# Patient Record
Sex: Male | Born: 1955 | Race: White | Hispanic: No | Marital: Married | Smoking: Never smoker
Health system: Southern US, Community
[De-identification: ages and names within clinical notes are randomized; demographics above are authoritative.]

## PROBLEM LIST (undated history)

## (undated) DIAGNOSIS — E119 Type 2 diabetes mellitus without complications: Secondary | ICD-10-CM

## (undated) DIAGNOSIS — I10 Essential (primary) hypertension: Secondary | ICD-10-CM

## (undated) DIAGNOSIS — E785 Hyperlipidemia, unspecified: Secondary | ICD-10-CM

## (undated) HISTORY — DX: Essential (primary) hypertension: I10

## (undated) HISTORY — DX: Hyperlipidemia, unspecified: E78.5

## (undated) HISTORY — DX: Type 2 diabetes mellitus without complications: E11.9

---

## 2017-03-26 ENCOUNTER — Ambulatory Visit (INDEPENDENT_AMBULATORY_CARE_PROVIDER_SITE_OTHER): Payer: BLUE CROSS/BLUE SHIELD | Admitting: Family Medicine

## 2017-03-26 ENCOUNTER — Encounter (INDEPENDENT_AMBULATORY_CARE_PROVIDER_SITE_OTHER): Payer: Self-pay

## 2017-03-26 VITALS — BP 180/100 | HR 77 | Temp 98.9°F | Resp 18 | Ht 67.0 in | Wt 260.0 lb

## 2017-03-26 DIAGNOSIS — I10 Essential (primary) hypertension: Secondary | ICD-10-CM

## 2017-03-26 DIAGNOSIS — R42 Dizziness and giddiness: Secondary | ICD-10-CM

## 2017-03-26 NOTE — Progress Notes (Signed)
Emergency Department Transfer    Pt transferred to: [facility]    Reason for transfer: R/O Stroke    Pertinent studies performed: see progress note below  Mode of transportation: Private Vehicle - Family/Friend to drive patient    Spoke to: Dr. Mellody Dance  at receiving facility    Pt expressed understanding of the reason for transfer to the ED, and agreed to go directly there.        Subjective:    Patient ID: Andre Burgess is a 61 y.o. male.    Dizziness   This is a new problem. The current episode started yesterday (woke up yesterday with dizziness ). The problem occurs constantly. The problem has been unchanged. Associated symptoms include headaches, nausea and weakness. Pertinent negatives include no abdominal pain, change in bowel habit, chest pain, chills, congestion, coughing, fatigue, fever, myalgias, neck pain, numbness, sore throat, vertigo, visual change or vomiting. Associated symptoms comments: 8/10 on pain scale; left frontal region. Sharp/stabbing pain no improved with motrin . Nothing aggravates the symptoms. He has tried NSAIDs for the symptoms. The treatment provided mild relief.       The following portions of the patient's history were reviewed and updated as appropriate: allergies, current medications, past medical history, past social history, past surgical history and problem list.    Review of Systems   Constitutional: Negative for chills, fatigue and fever.   HENT: Negative for congestion and sore throat.    Respiratory: Negative for cough.    Cardiovascular: Negative for chest pain.   Gastrointestinal: Positive for nausea. Negative for abdominal pain, change in bowel habit and vomiting.   Musculoskeletal: Negative for myalgias and neck pain.   Neurological: Positive for dizziness, weakness and headaches. Negative for vertigo and numbness.         Objective:    BP (!) 180/100   Pulse 77   Temp 98.9 F (37.2 C) (Oral)   Resp 18   Ht 1.702 m (5\' 7" )   Wt 117.9 kg (260 lb)    SpO2 97%   BMI 40.72 kg/m     BP elevated; reviewed. Pt reports that his BP is usually wnl.     Physical Exam   Constitutional: He is oriented to person, place, and time. He appears well-developed and well-nourished. No distress.   HENT:   Head: Normocephalic and atraumatic.   Right Ear: Tympanic membrane, external ear and ear canal normal.   Left Ear: Tympanic membrane, external ear and ear canal normal.   Nose: Nose normal.   Mouth/Throat: Uvula is midline, oropharynx is clear and moist and mucous membranes are normal.   Eyes: Pupils are equal, round, and reactive to light. Conjunctivae and EOM are normal.   Neck: Normal range of motion. Neck supple.   Cardiovascular: Normal rate and regular rhythm.    Pulmonary/Chest: Effort normal and breath sounds normal.   Musculoskeletal: Normal range of motion.   Neurological: He is alert and oriented to person, place, and time. He has normal strength. No cranial nerve deficit or sensory deficit. He displays a negative Romberg sign. Coordination and gait normal. GCS eye subscore is 4. GCS verbal subscore is 5. GCS motor subscore is 6.   Reflex Scores:       Patellar reflexes are 2+ on the right side and 2+ on the left side.       Achilles reflexes are 2+ on the right side and 2+ on the left side.  Skin: Skin is warm  and dry. He is not diaphoretic.   Psychiatric: He has a normal mood and affect.   Nursing note and vitals reviewed.        Assessment and Plan:       Andre Burgess was seen today for dizziness.    Diagnoses and all orders for this visit:    Dizziness  -     POCT Glucose, Fingerstick 141  -     ECG 12 lead- nsr; RBBB, no STEMI   -     Pulse oximetry, spot 97% on RA   Due to concern for possible stroke vs symptomatic hypertensive urgency advised transfer to the ER for further evaluation and treatment   Patient/guardian expressed understanding and agreement with plan of care at time of discharge.          Isaiah Blakes, MD  [facility]  03/26/2017   1:49 PM

## 2017-03-29 ENCOUNTER — Telehealth (INDEPENDENT_AMBULATORY_CARE_PROVIDER_SITE_OTHER): Payer: Self-pay

## 2017-03-29 NOTE — Telephone Encounter (Signed)
Called to check on patient after recent visit.  Patient did not go to the ED.

## 2017-08-10 IMAGING — DX THORACIC SPINE 3 VIEWS
1 series · 6 of 6 positions shown · non-contrast
Comparison: None

THORACIC SPINE 3 VIEWS 08/10/2017 [DATE]: 
CLINICAL INDICATION: Lower thoracic pain

[Series 1: AP · U · 0.14mm/px · 6 of 6 slices shown]
[im 1/6]
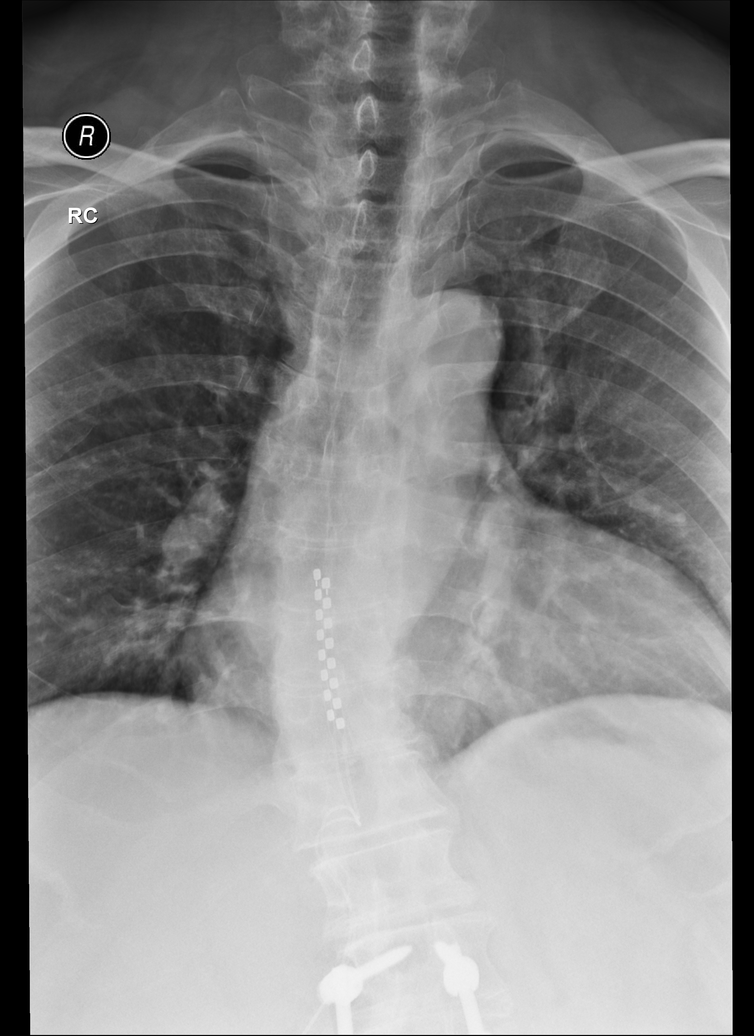
[im 2/6]
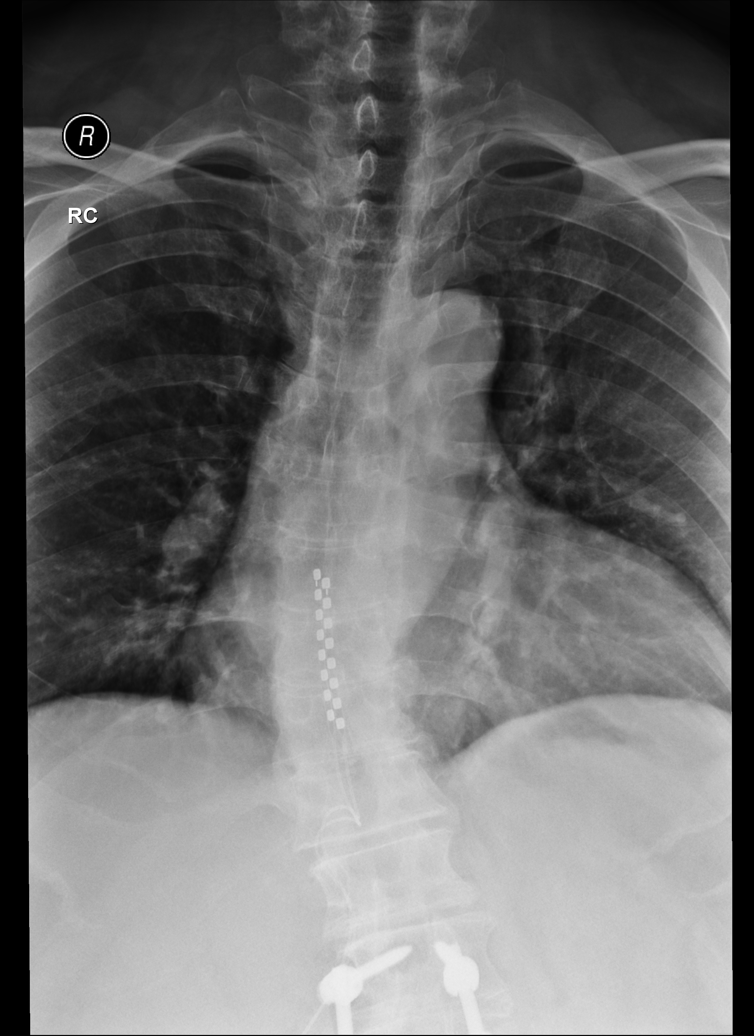
[im 3/6]
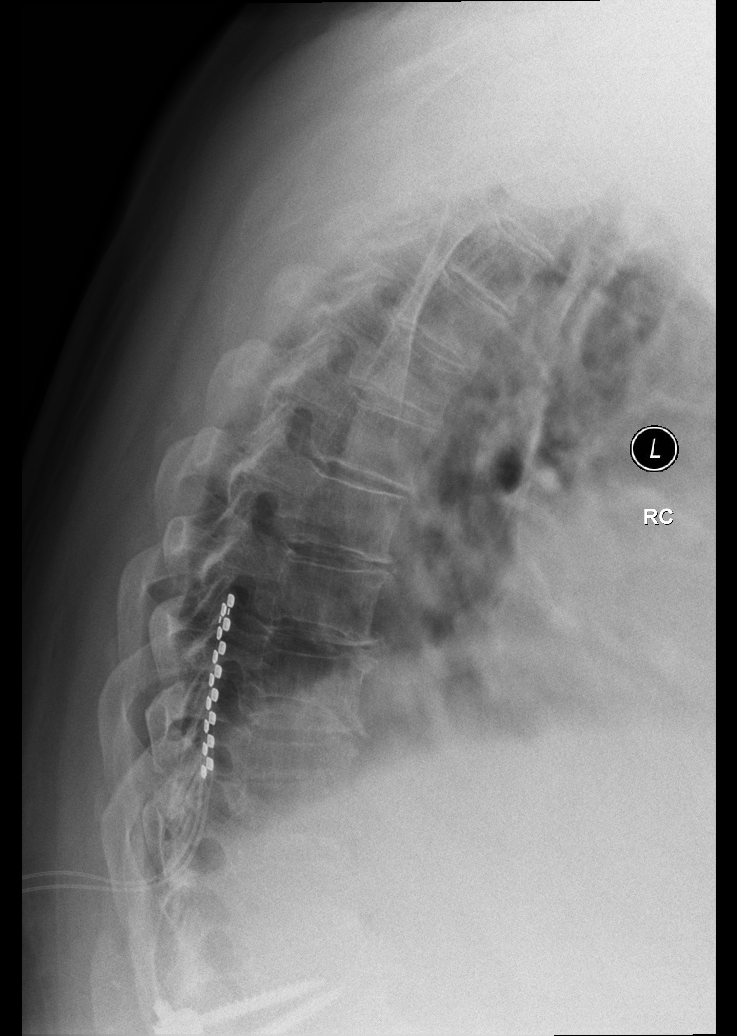
[im 4/6]
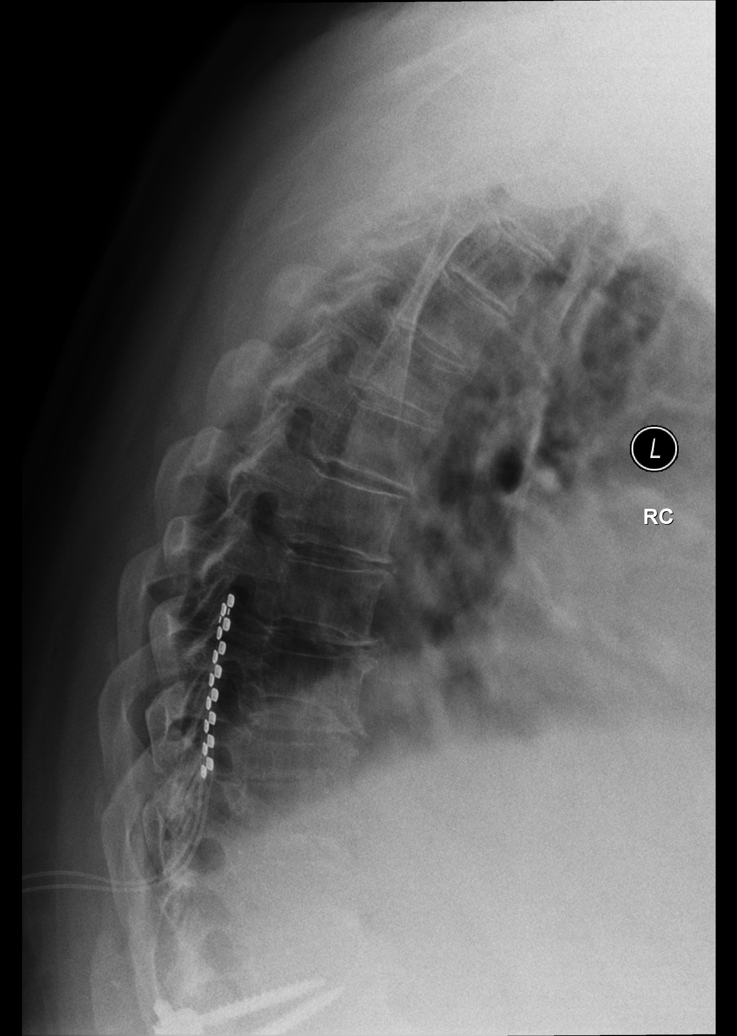
[im 5/6]
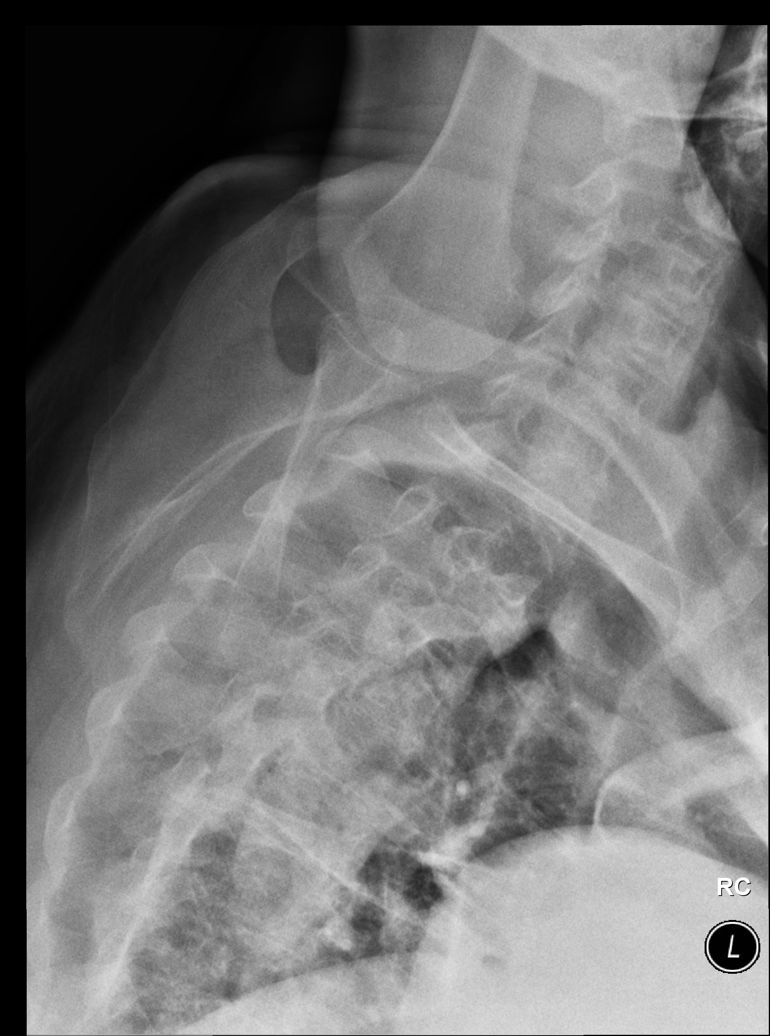
[im 6/6]
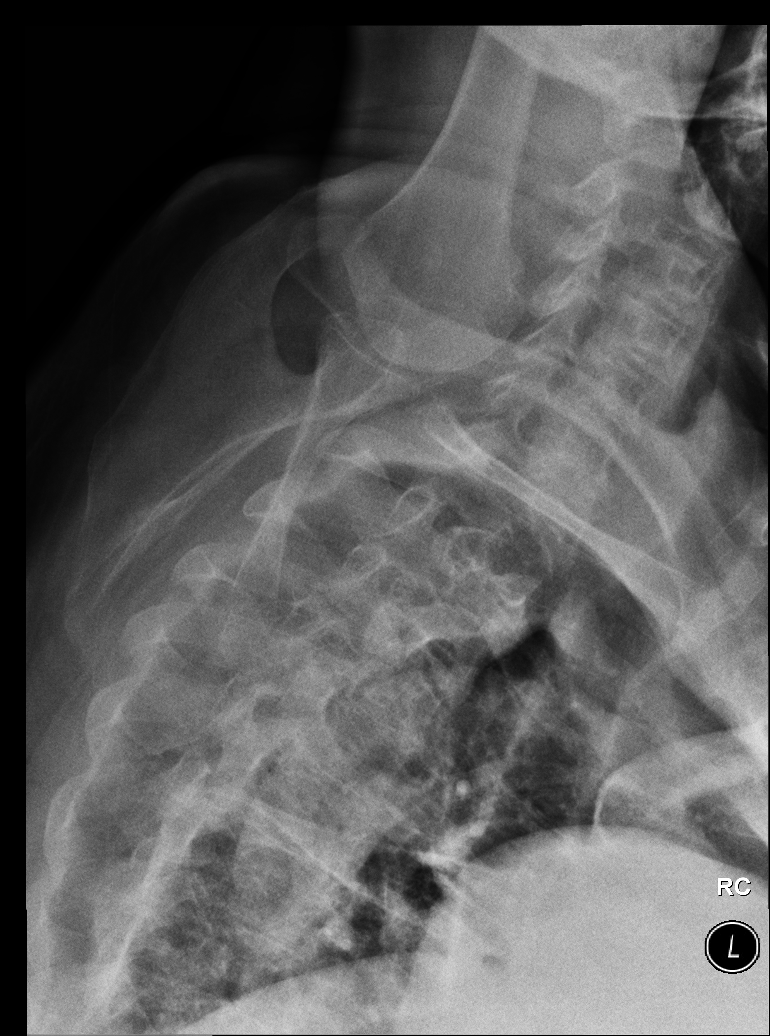

[6 of 6 positions shown; findings below may reference images not displayed]

FINDINGS: Thoracic vertebral heights are intact. There is moderate lower 
thoracic dextroscoliosis. There are degenerative changes, including moderate 
multilevel disc narrowing with endplate sclerosis and mild marginal 
osteophyte. 
There is a pain stimulator, with the stimulator array lying opposite T9-T11, 
without evidence for lead fracture. There are lumbar pedicle screws, not 
completely evaluated.
IMPRESSION: Spinal stimulator in the lower thoracic spine appears appropriately 
positioned. 
Degenerative changes associated with moderate lower thoracic dextroscoliosis. 
No 
evidence for fracture or bone destruction.

## 2017-08-29 IMAGING — CT CT LUMBAR SPINE W/WO CONTRAST
4 of 10 series · 7 of 33 positions shown, 8 images · IV contrast (APPLIED)
Comparison: 

CT LUMBAR SPINE W/WO CONTRAST, 08/29/2017 [DATE]: 
CLINICAL INDICATION: Drainage from incision site for spinal cord stimulator 
implant 
A search for DICOM formatted images was conducted for prior CT imaging studies 
completed at a non-affiliated media free facility.
TECHNIQUE: The lumbar spine was scanned from T12 through mid-sacrum without 
and 
with 033cc Isovue 300 contrast injected intravenously on a high-resolution CT 
scanner using dose reduction techniques. Routing MPR reconstructions were 
performed.

[Series 4: l spine 2.0 b31s · axial · 0.49mm/px · z∈[-240,-112]mm · 2 of 194 slices shown, 3 images]
[im 65/194  soft-tissue]
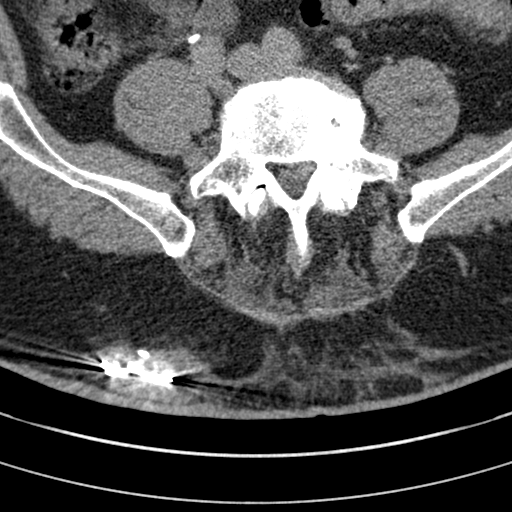
[im 65/194  bone]
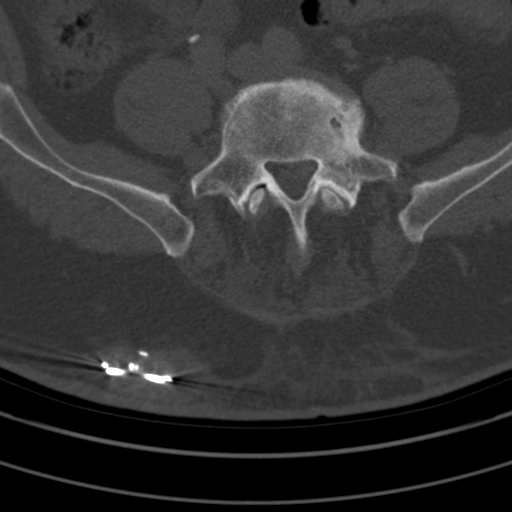
[im 129/194  bone]
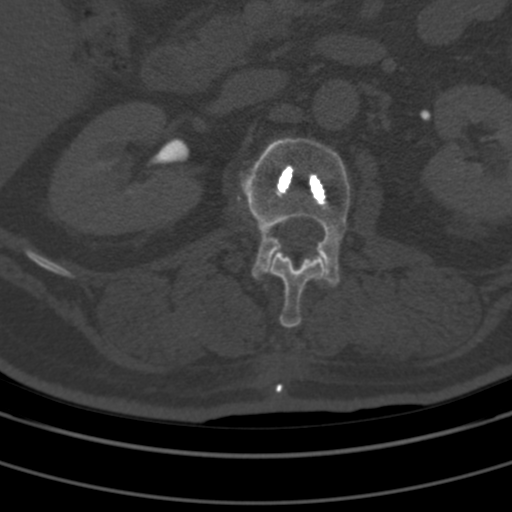

[Series 6: coronal · coronal · 0.50mm/px · 1 of 104 slices shown]
[im 52/104  bone]
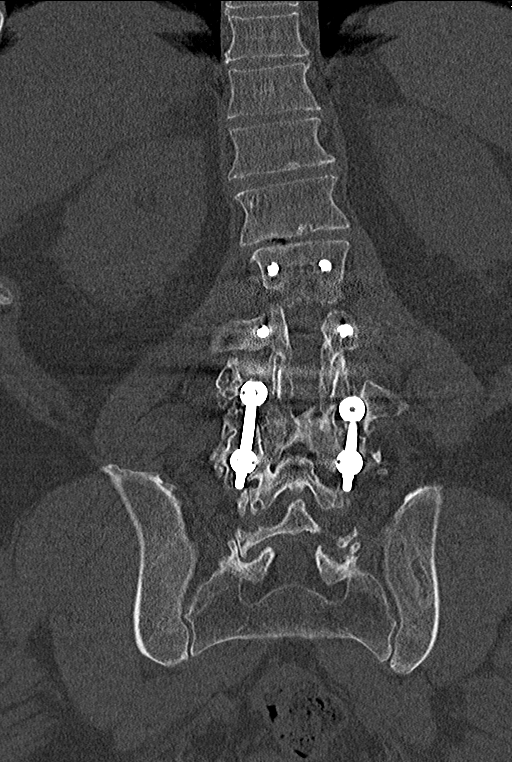

[Series 7: sagittal bone · sagittal · 0.49mm/px · 2 of 130 slices shown]
[im 44/130  bone]
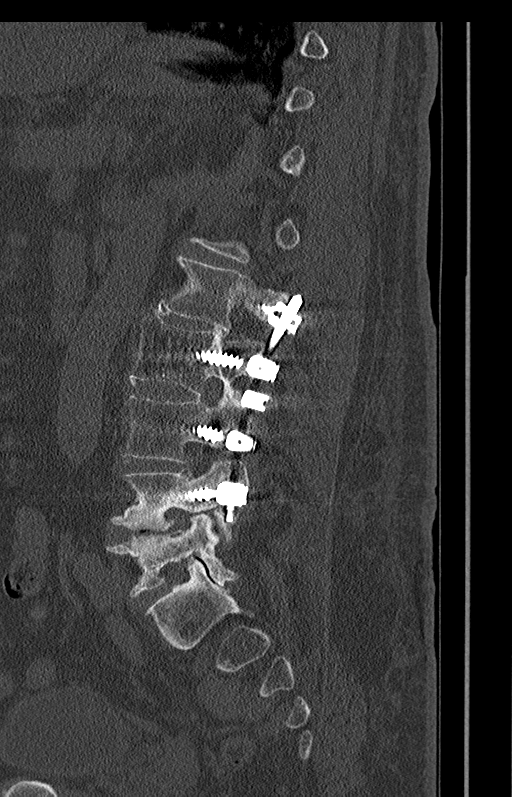
[im 87/130  bone]
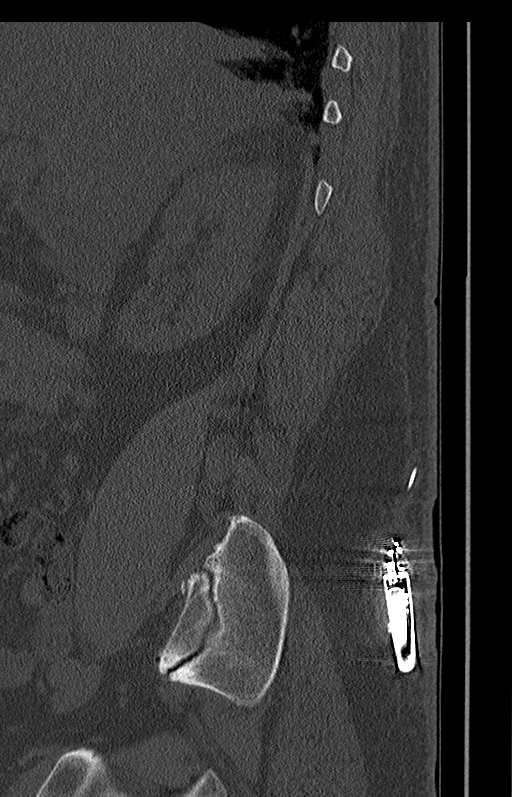

[Series 9: l spine 2.0 (person_name)31(person_name) · axial · 0.49mm/px · z∈[-240,-112]mm · 2 of 194 slices shown]
[im 65/194  bone]
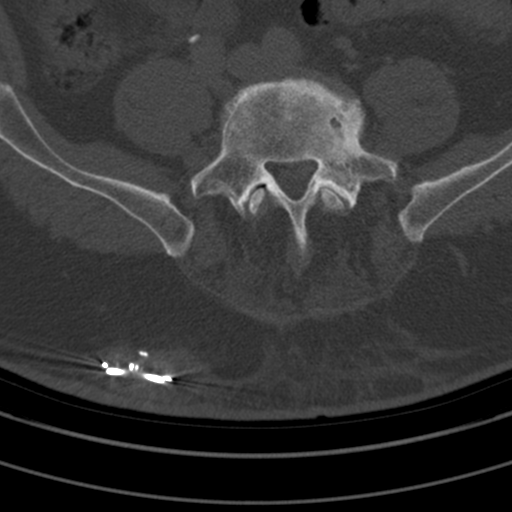
[im 129/194  bone]
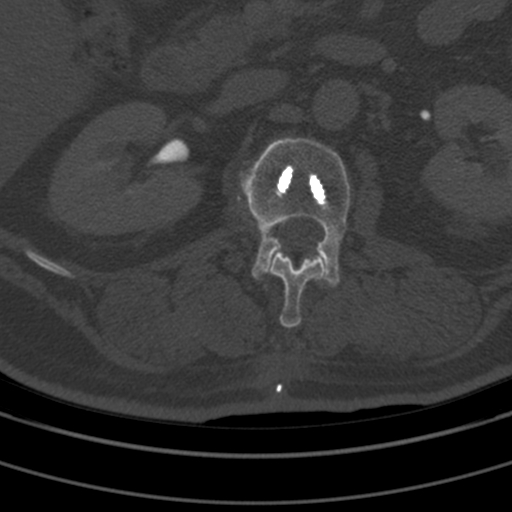

[7 of 33 positions shown; findings below may reference images not displayed]

FINDINGS: There is a spinal cord stimulator entering the neural canal at T10- 
11. 
There has been T11 posterior decompression. There is a small low density 
collection at the skin surface best seen on axial image 48 measuring 8 mm. 
This 
likely represents a small seroma. There is a mild rind of isodense soft tissue 
and mild convex bulging at the skin surface. Cellulitis with developing 
abscess 
cannot be excluded and correlation necessary. There is increased soft tissue 
surrounding the stimulator leads as they course horizontally toward the neural 
canal, most likely representing postoperative changes. No fluid collection in 
this region identified. The leads of the stimulator extends cephalad, with the 
stimulator array appearing opposite T8-T10. There is no evidence for lumbar 
fracture. The patient is status post bilateral pedicle screw fusion from L1 
through L4. There is marked disc narrowing at L1-2 and moderate to marked 
narrowing at L4-5. There is endplate sclerosis at L4-5. No fracture. No 
aggressive bone destruction. There is air within the T12-L1 interspace, most 
likely degenerative. There is no evidence for endplate erosion. 
Axial images are limited by metal artifact. There has been posterior 
decompression at L2, L3 and L4. There does not appear to be significant canal 
stenosis. There is right posterolateral disc-osteophyte at L1-2 mildly 
effacing 
the right ventral thecal sac, potentially encroaching on the exiting right L2 
nerve root as seen on axial images 77-79. The stimulator device overlies the 
upper right buttocks, opposite the posterior medial right iliac wing. There 
are 
changes consistent with bone harvest site along the posteromedial left iliac 
wing. There has been posterior element fusion in the mid lumbar spine.
IMPRESSION: 8 mm low-density collection of the posterior skin surface at the T11 level, at 
the level of entrance of the neural stimulator lead into the neural canal. 
Patient is status post T11 posterior decompression. This collection is 
indeterminant, potentially a small seroma, although focal cellulitis with 
developing abscess cannot be excluded. Soft tissue surrounds the stimulator 
leads as they course horizontally at this level to enter the neural canal, 
without evidence for fluid density. Clinical correlation is necessary. 
No fracture. Air in the intervertebral disc spaces at T12-L1 and L4-5 appears 
degenerative. No specific findings to indicate spondylodiscitis. 
Status post L1-L4 pedicle screw fusion with connecting rods. Hardware appears 
well-positioned. There has been posterior decompression from L2 through L4. No 
significant lumbar canal stenosis, allowing for metal artifact. 
Mild lumbar levoscoliosis. Status post mid lumbar posterior element fusion 
with 
bone harvest site along the posterior medial left iliac bone. 
RADIATION DOSE REDUCTION: All CT scans are performed using radiation dose 
reduction techniques, when applicable.  Technical factors are evaluated and 
adjusted to ensure appropriate moderation of exposure.  Automated dose 
management technology is applied to adjust the radiation doses to minimize 
exposure while achieving diagnostic quality images.

## 2018-02-22 IMAGING — CT CT THORACIC SPINE W/WO CONTRAST
5 of 7 series · 14 of 33 positions shown, 15 images · IV contrast (isovue)
Comparison: Comparison was made to the prior exam(s) within the last 12 
months 
dated  plain film examination of the thoracic spine on August 10, 2017

CT THORACIC SPINE W/WO CONTRAST, 02/22/2018 [DATE]: 
CLINICAL INDICATION: Low back pain. Postlaminectomy syndrome. 
A search for DICOM formatted images was conducted for prior CT imaging studies 
completed at a non-affiliated media free facility.
TECHNIQUE: The thoracic spine was scanned from C7 through L1 vertebra without 
and with 566cc's of Isovue 300 injected intravenously on a high-resolution CT 
scanner using dose reduction techniques. Routing MPR reconstructions were 
performed.

[Series 2: t spine 2.0 b31s · axial · 0.47mm/px · z∈[-284,-146]mm · 2 of 209 slices shown (1 of 2)]
[im 70/209  bone]
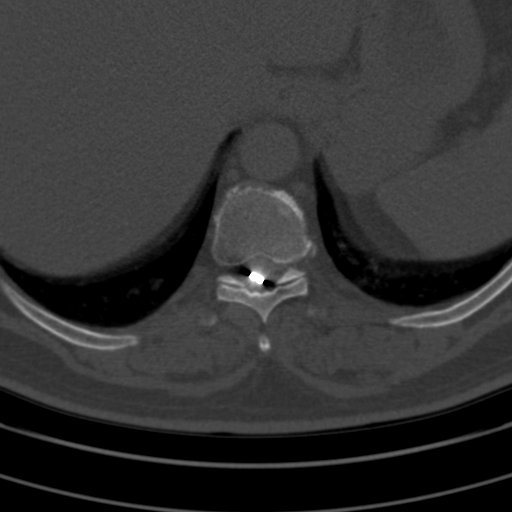
[im 139/209  bone]
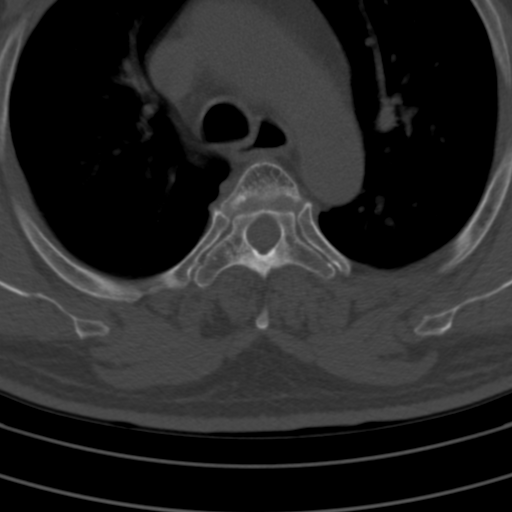

[Series 3: t spine 2.0 b31s · axial · 0.47mm/px · z∈[-314,-110]mm · 3 of 206 slices shown, 4 images (2 of 2)]
[im 52/206  soft-tissue]
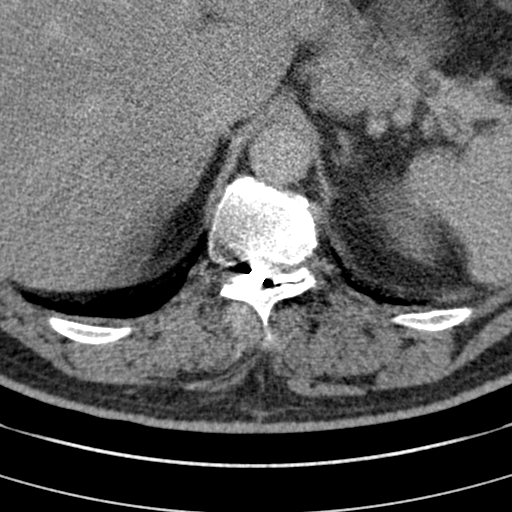
[im 52/206  bone]
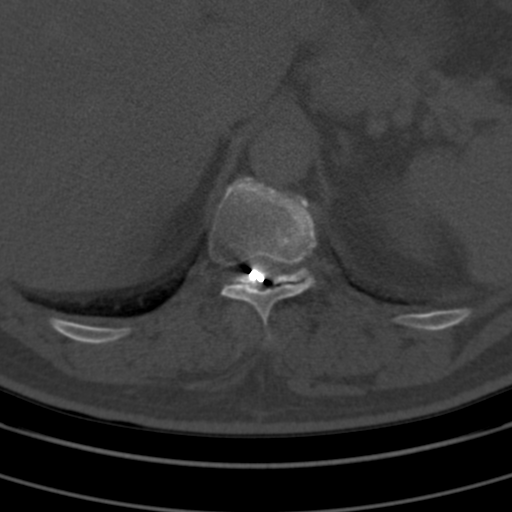
[im 103/206  bone]
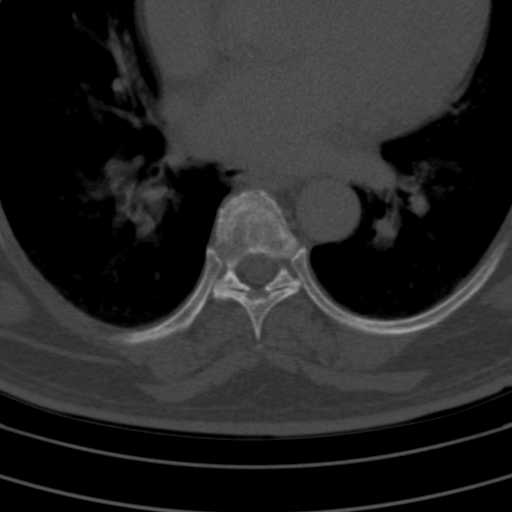
[im 154/206  bone]
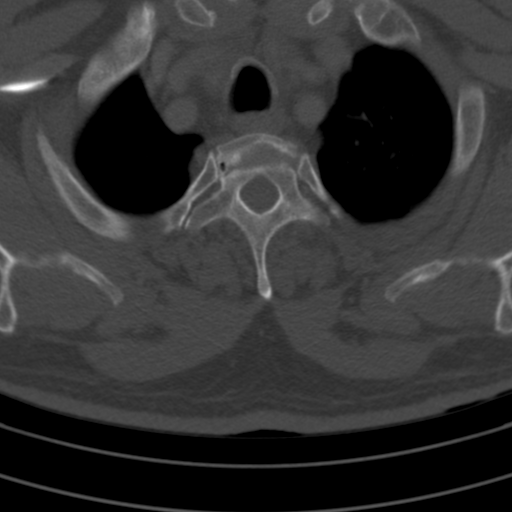

[Series 5: coronal · coronal · 0.50mm/px · 1 of 104 slices shown]
[im 52/104  bone]
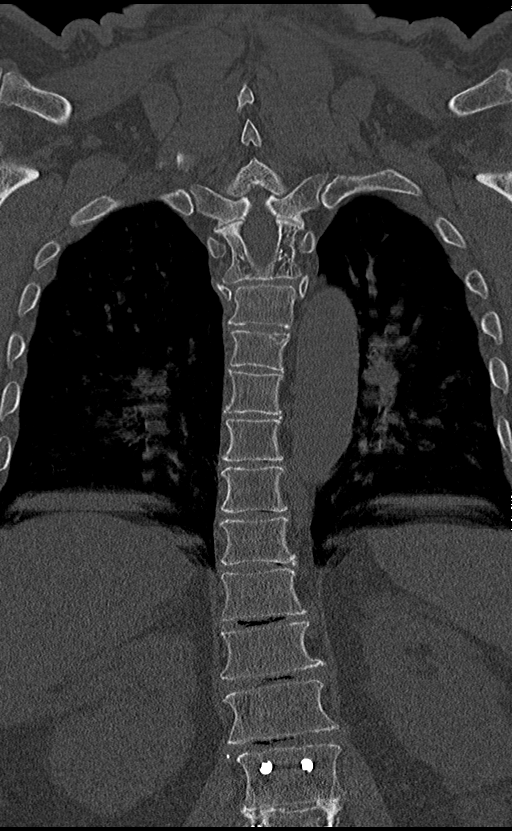

[Series 7: sagittal st · sagittal · 0.46mm/px · 5 of 126 slices shown]
[im 21/126  bone]
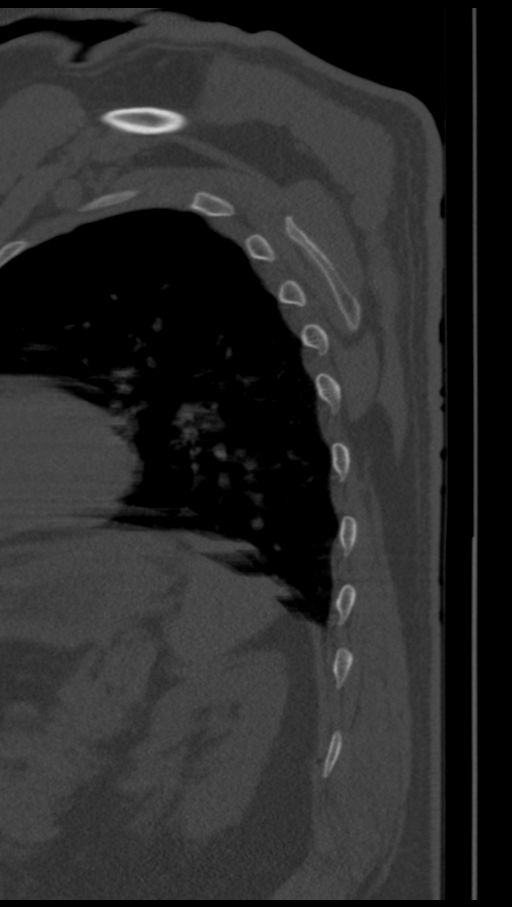
[im 42/126  bone]
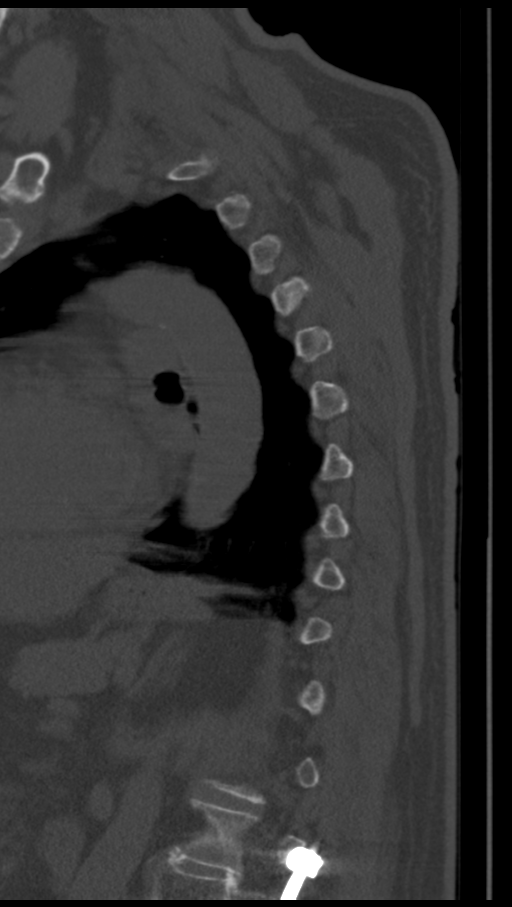
[im 63/126  bone]
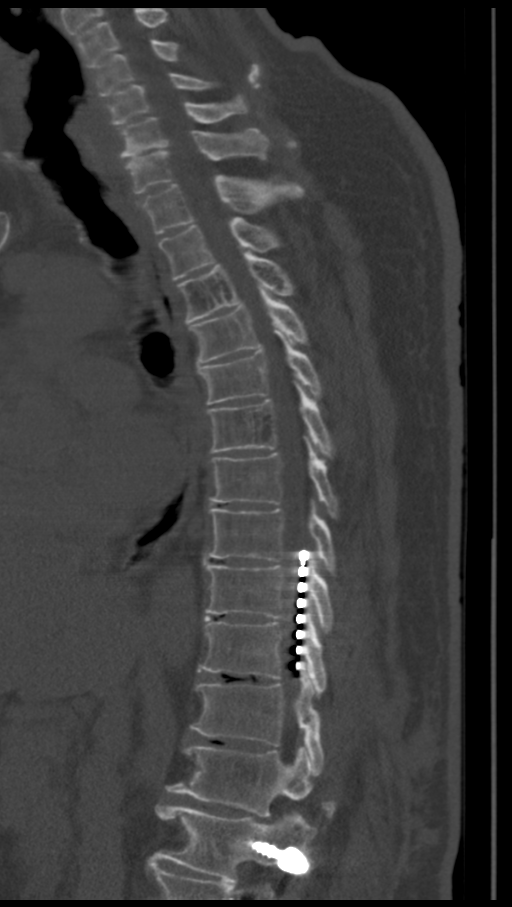
[im 84/126  bone]
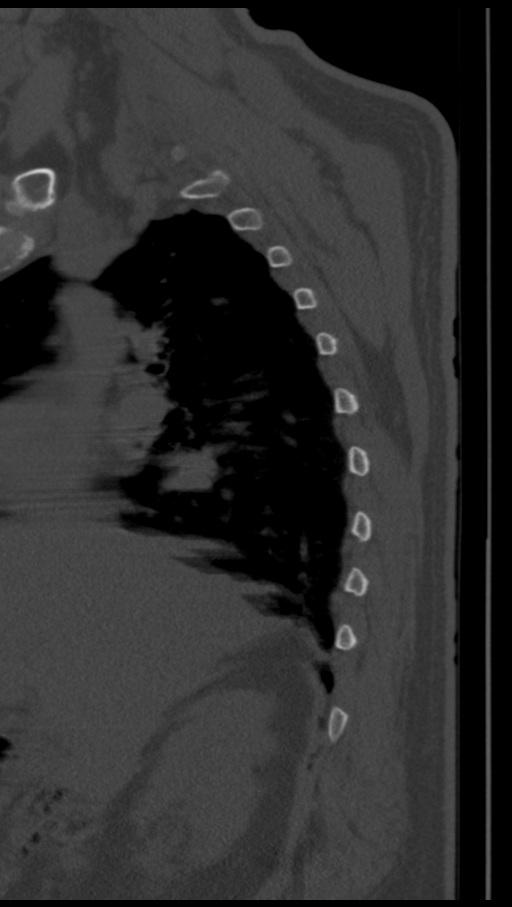
[im 105/126  bone]
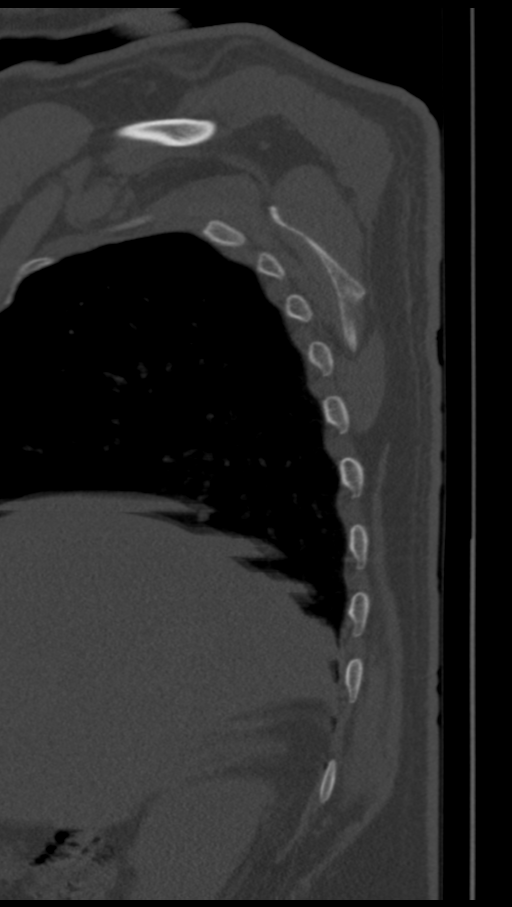

[Series 8: t spine 2.0 bone · axial · 0.47mm/px · z∈[-312,-108]mm · 3 of 205 slices shown]
[im 52/205  bone]
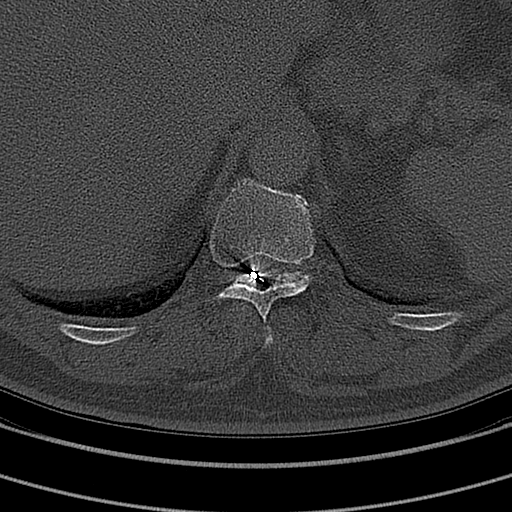
[im 103/205  bone]
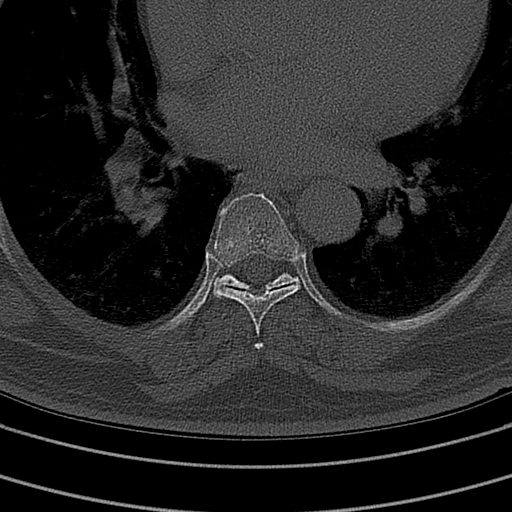
[im 154/205  bone]
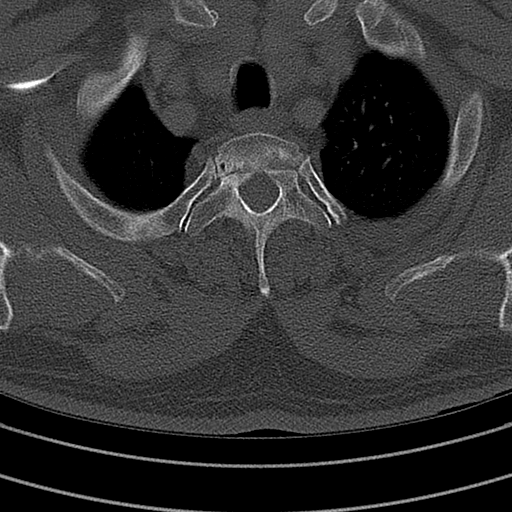

[14 of 33 positions shown; findings below may reference images not displayed]

FINDINGS: Spinal stimulator is visible at the lower thoracic spine. Moderate 
degenerative arthritis in the thoracic spine is present with small marginal 
osteophytes. No compression fracture. No osteolytic or osteoblastic lesion. 
Vertical striation at T4 and T7 are present from vertebral hemangiomas. 
Degenerative disc disease at C7-T1 level is present no lytic or blastic 
lesion. 
Pedicles are intact. The spinal canal is open.
IMPRESSION: Moderate degenerative arthritis at the thoracic spine. 
RADIATION DOSE REDUCTION: All CT scans are performed using radiation dose 
reduction techniques, when applicable.  Technical factors are evaluated and 
adjusted to ensure appropriate moderation of exposure.  Automated dose 
management technology is applied to adjust the radiation doses to minimize 
exposure while achieving diagnostic quality images.

## 2019-01-31 IMAGING — CT CT BRAIN WITHOUT CONTRAST
3 series · 15 of 47 positions shown, 18 images · non-contrast
Comparison: 

CT BRAIN WITHOUT CONTRAST, 01/31/2019 [DATE]: 
CLINICAL INDICATION:  Right hemiparesis, hemiplegia x2 hours 
A search for DICOM formatted images was conducted for prior CT imaging studies 
completed at a non-affiliated media free facility.
TECHNIQUE: The head was scanned from vertex through skull base without contrast 
on a high resolution CT scanner using dose reduction techniques. Routine MPR 
reconstructions were performed

[Series 2: head w/o 3.0 j30s 1 · axial · non-contrast · 0.43mm/px · z∈[-178,-28]mm · 9 of 58 slices shown, 12 images]
[im 4/58  brain]
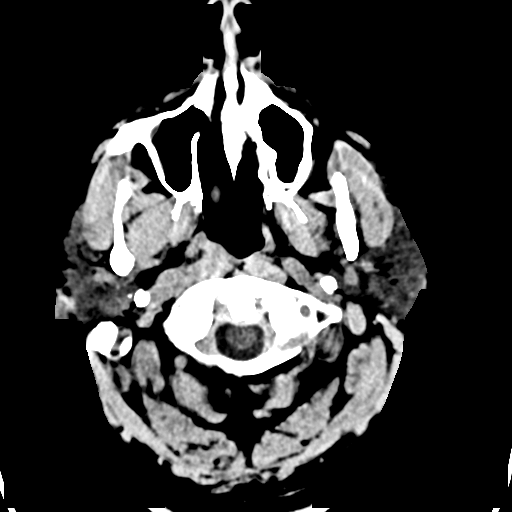
[im 4/58  bone]
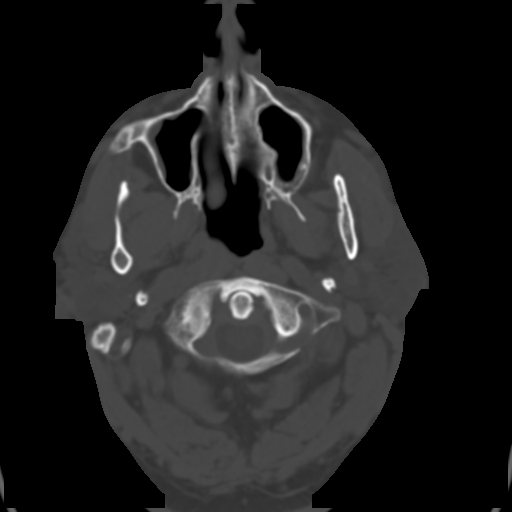
[im 10/58  brain]
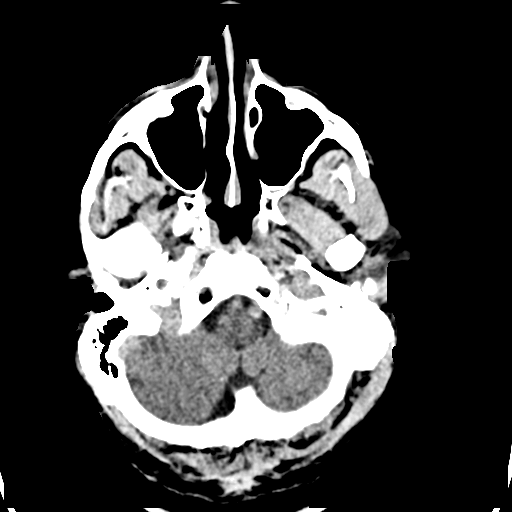
[im 16/58  brain]
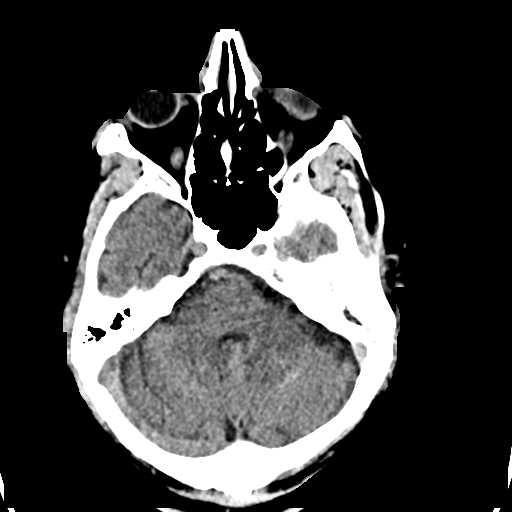
[im 22/58  brain]
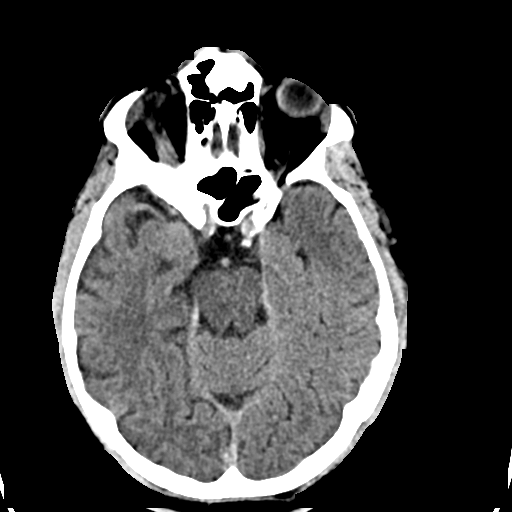
[im 30/58  brain]
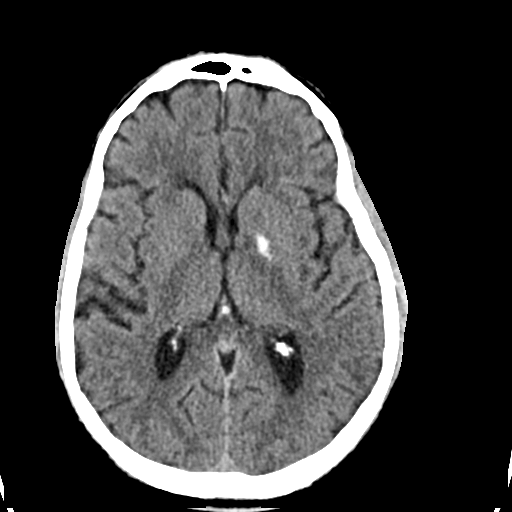
[im 30/58  bone]
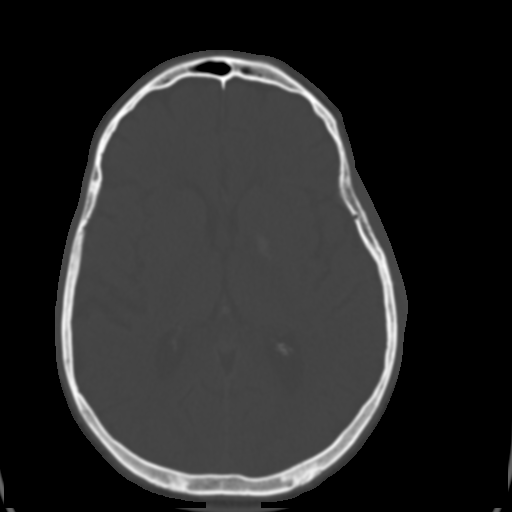
[im 36/58  brain]
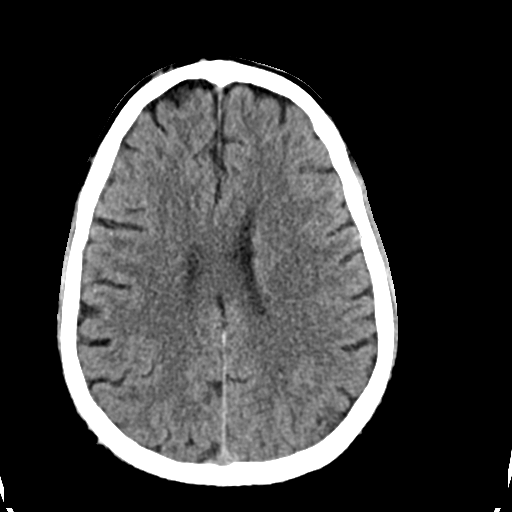
[im 42/58  brain]
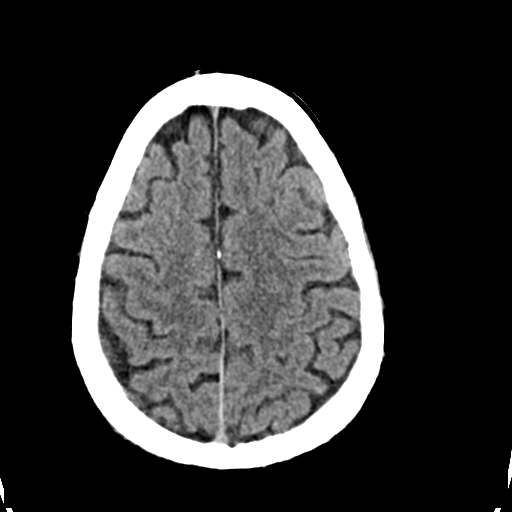
[im 48/58  brain]
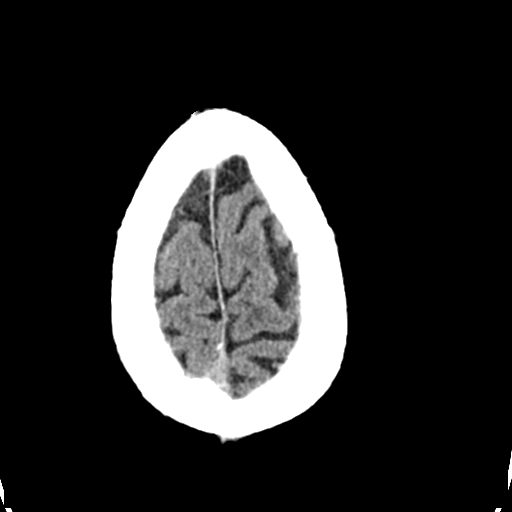
[im 54/58  brain]
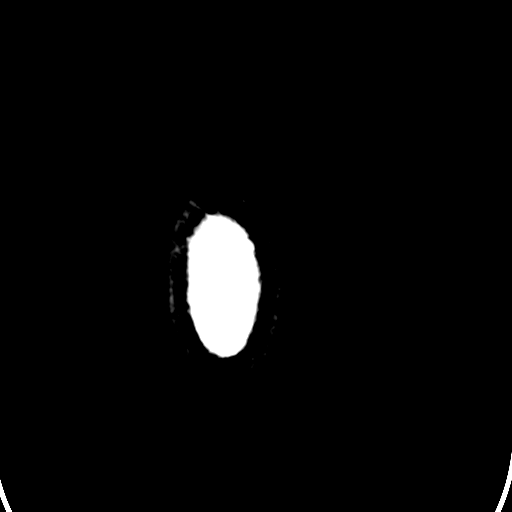
[im 54/58  bone]
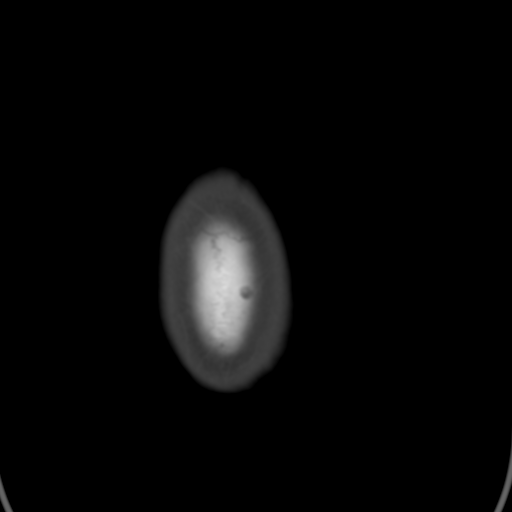

[Series 4: coronal · coronal · 0.33mm/px · 3 of 109 slices shown]
[im 37/109  brain]
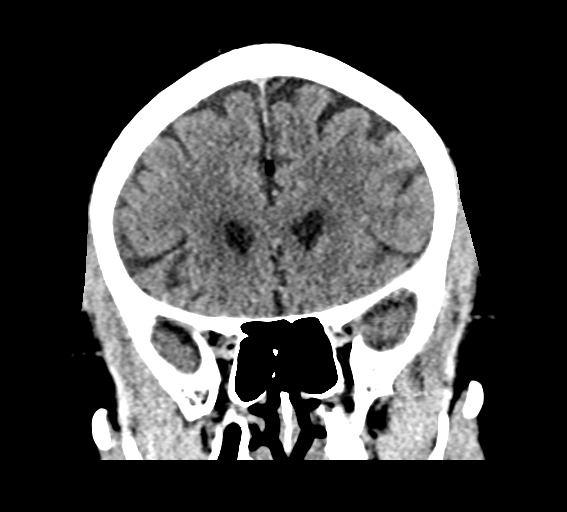
[im 49/109  brain]
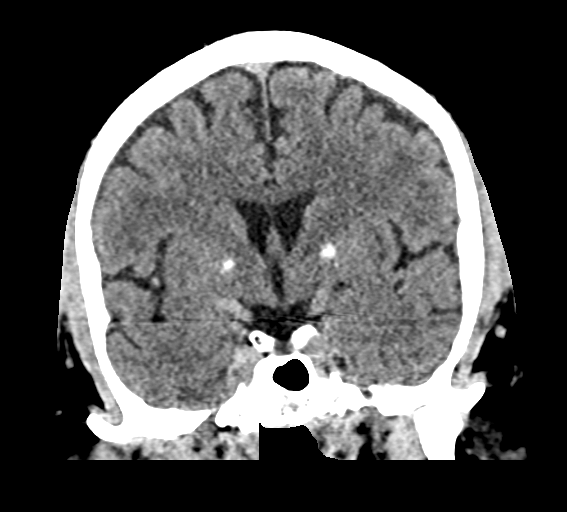
[im 61/109  brain]
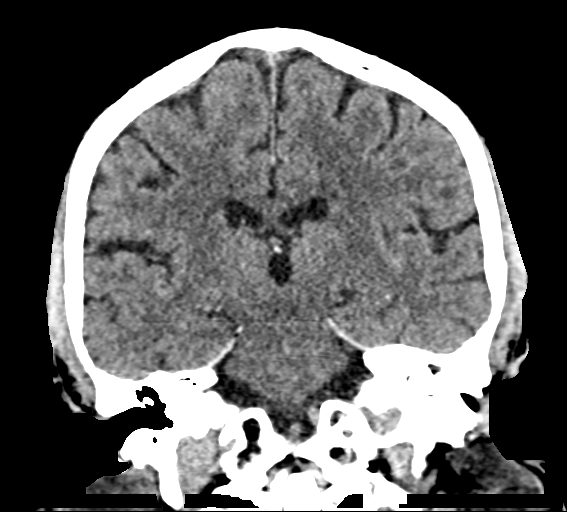

[Series 5: sag · sagittal · 0.36mm/px · 3 of 61 slices shown]
[im 21/61  brain]
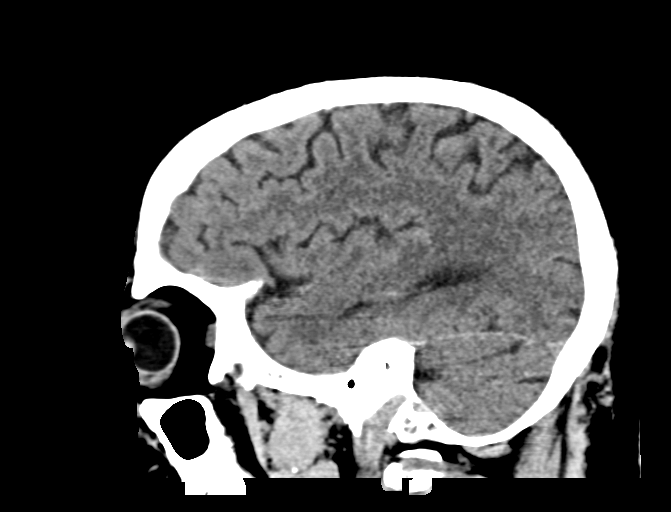
[im 31/61  brain]
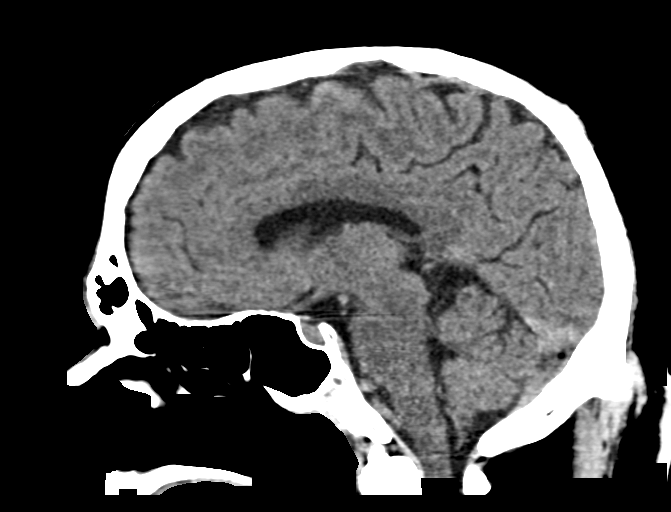
[im 41/61  brain]
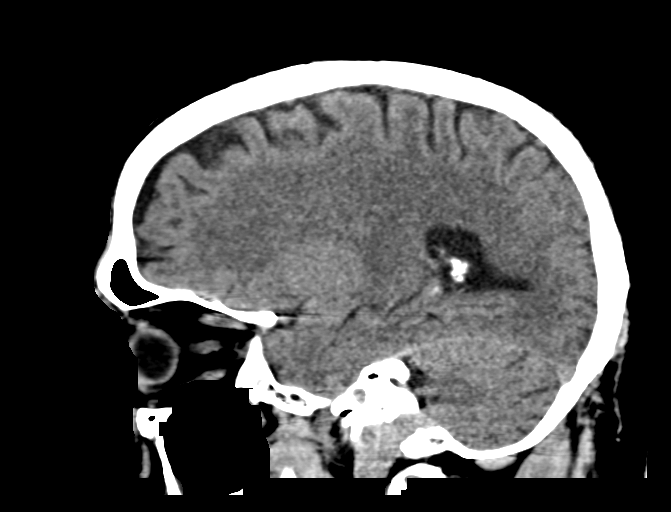

[15 of 47 positions shown; findings below may reference images not displayed]

FINDINGS: There is no evidence for recent infarct, hemorrhage or mass. No 
parenchymal density abnormality identified. There are calcifications in the 
globus pallidotomy, likely neoplastic. There are arteriosclerotic changes 
involving the cavernous and supraclinoid carotid and middle cerebral arteries 
bilaterally.
IMPRESSION: No acute infarct identified. There are arteriosclerotic changes, including of 
the middle cerebral artery territories bilaterally. If indicated MRI would be 
useful for further evaluation. 
RADIATION DOSE REDUCTION: All CT scans are performed using radiation dose 
reduction techniques, when applicable.  Technical factors are evaluated and 
adjusted to ensure appropriate moderation of exposure.  Automated dose 
management technology is applied to adjust the radiation doses to minimize 
exposure while achieving diagnostic quality images.

## 2019-02-04 IMAGING — MR MRI BRAIN WITHOUT CONTRAST
4 of 8 series · 25 of 48 positions shown · IV contrast (gadolinium)
Comparison: None.

MRI BRAIN WITHOUT CONTRAST, 02/04/2019 [DATE]: 
CLINICAL INDICATION: TIA.
TECHNIQUE: Multiplanar, multiecho position MR images of the brain were performed 
without intravenous gadolinium enhancement.

[Series 401: FLAIR · axial · 5.0mm · 0.62mm/px · z∈[-65,+90]mm · 4 of 27 slices shown]
[im 1/27]
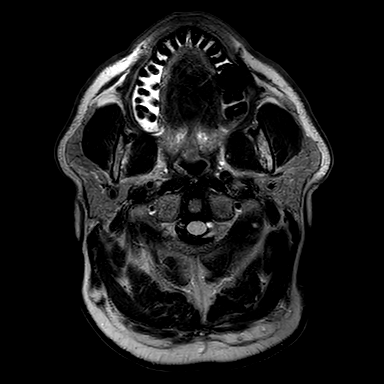
[im 9/27]
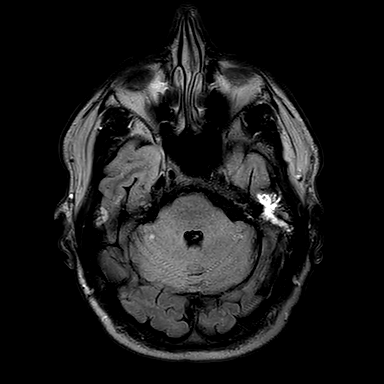
[im 18/27]
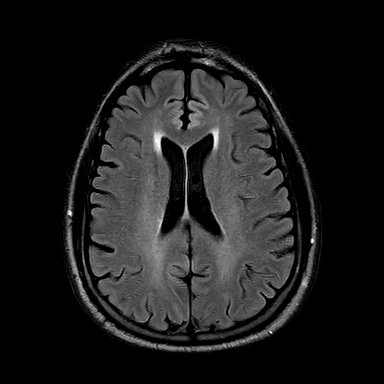
[im 27/27]
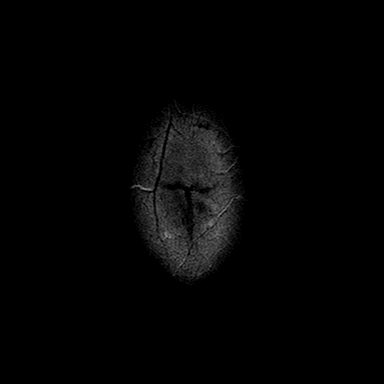

[Series 601: SWI · axial · 3.0mm · 0.53mm/px · z∈[-54,+78]mm · 11 of 102 slices shown]
[im 7/102]
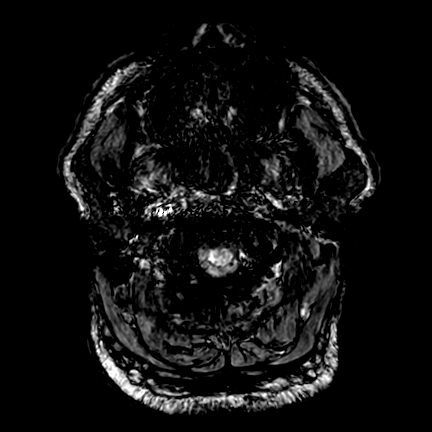
[im 13/102]
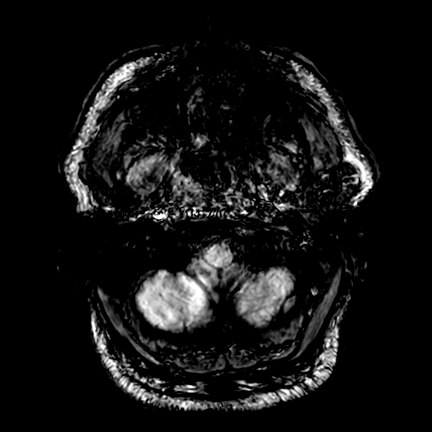
[im 19/102]
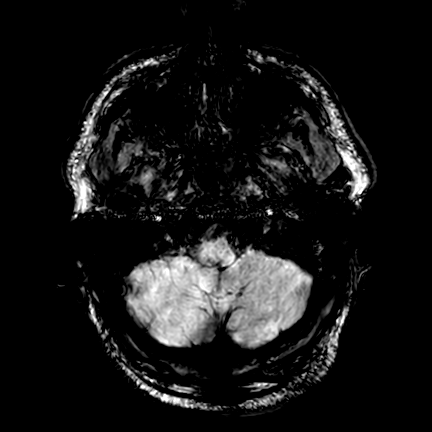
[im 32/102]
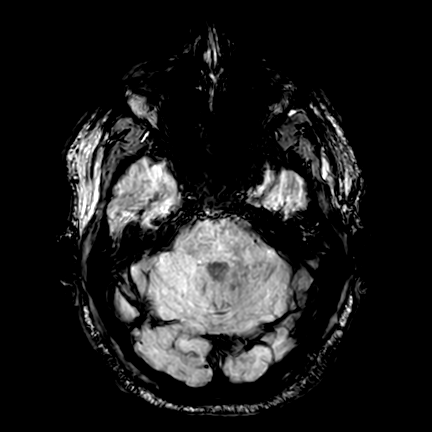
[im 45/102]
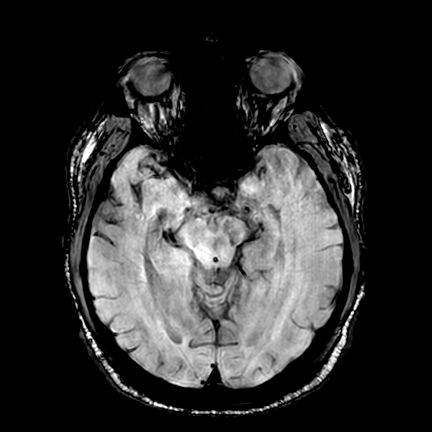
[im 51/102]
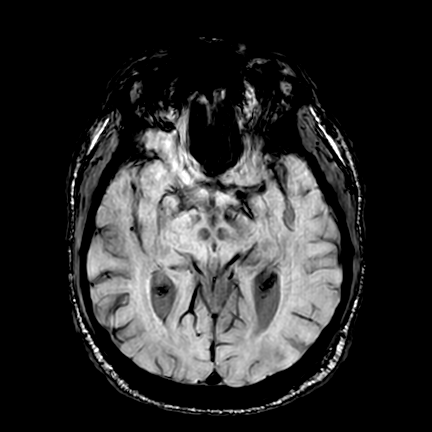
[im 57/102]
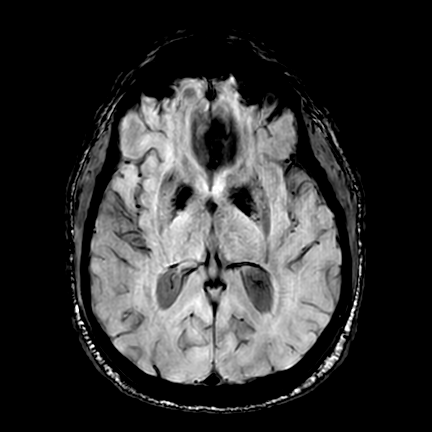
[im 70/102]
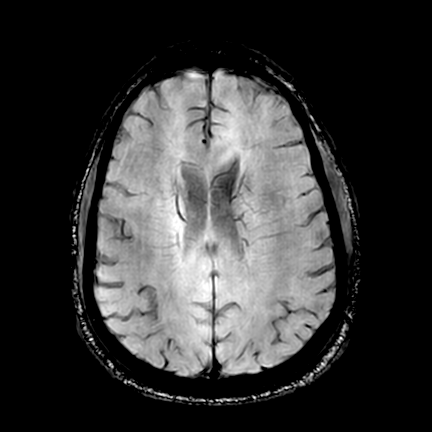
[im 83/102]
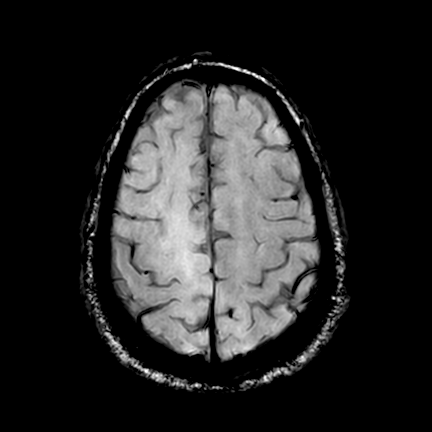
[im 89/102]
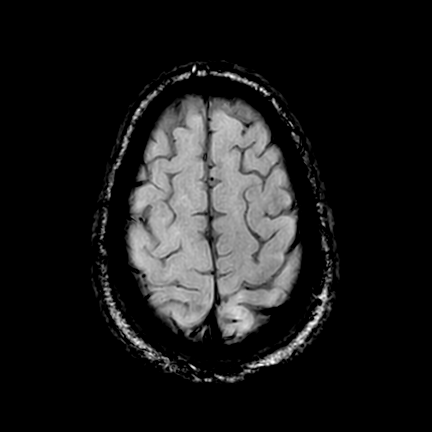
[im 95/102]
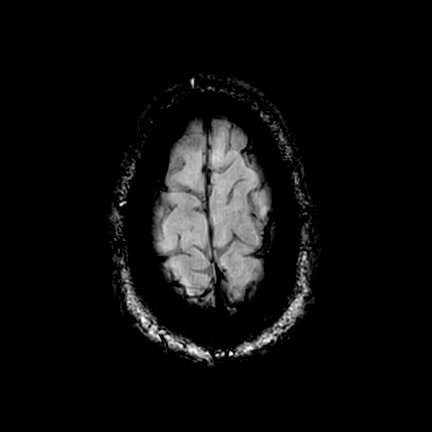

[Series 701: T2 · axial · 5.0mm · 0.43mm/px · z∈[-65,+90]mm · 4 of 27 slices shown (1 of 2)]
[im 1/27]
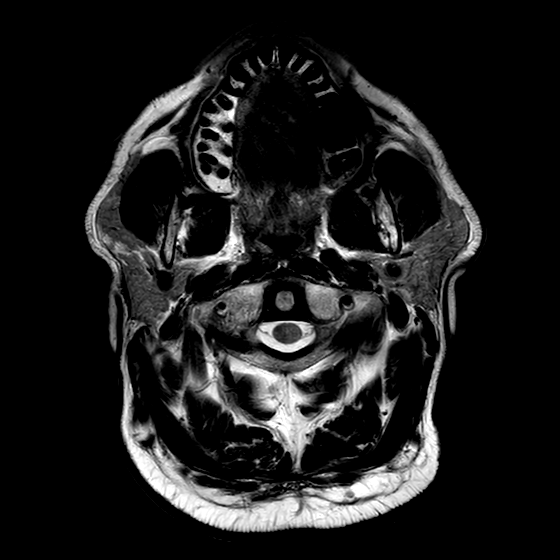
[im 9/27]
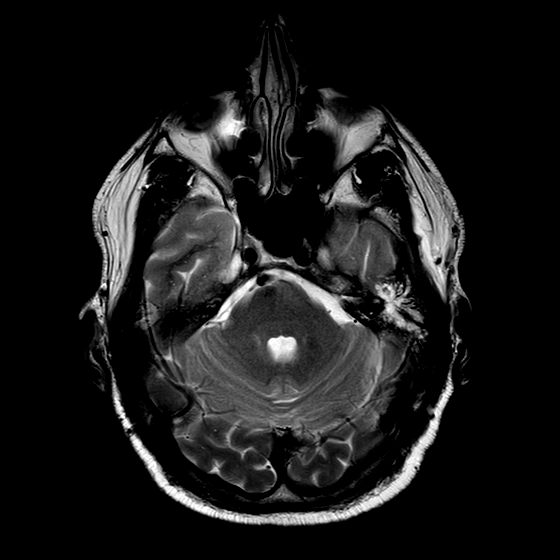
[im 18/27]
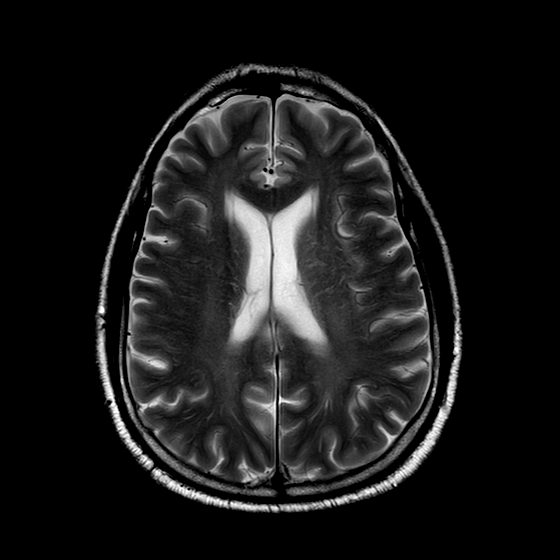
[im 27/27]
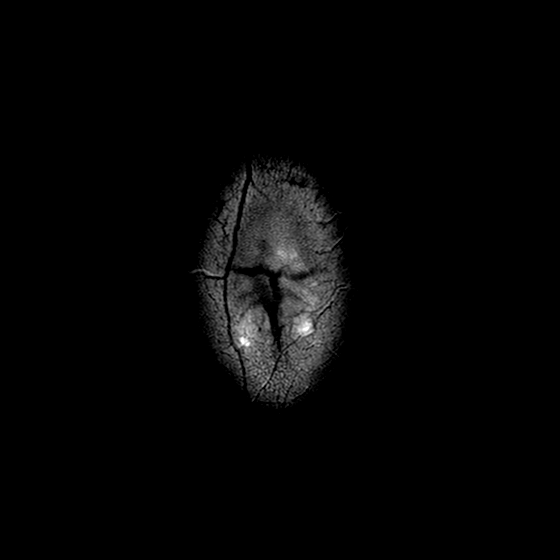

[Series 801: T2 · coronal · 4.0mm · 0.47mm/px · 6 of 36 slices shown (2 of 2)]
[im 1/36]
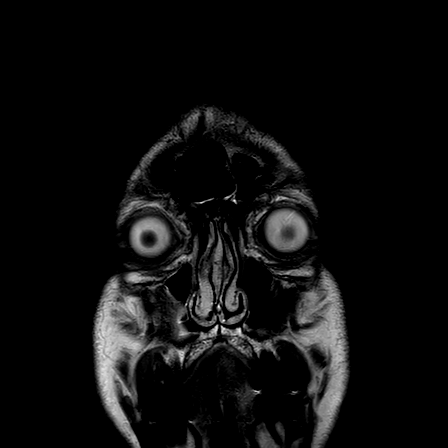
[im 8/36]
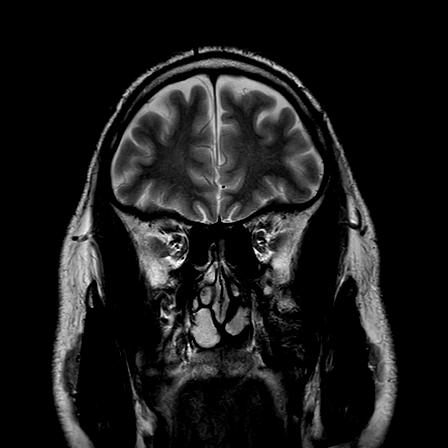
[im 15/36]
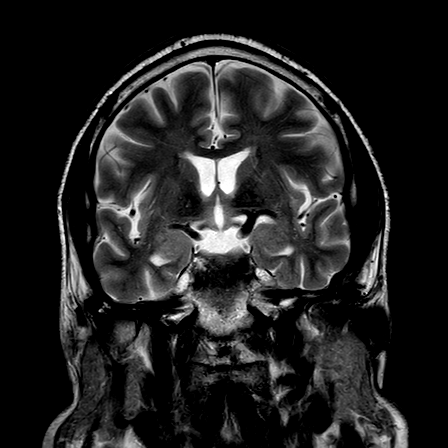
[im 22/36]
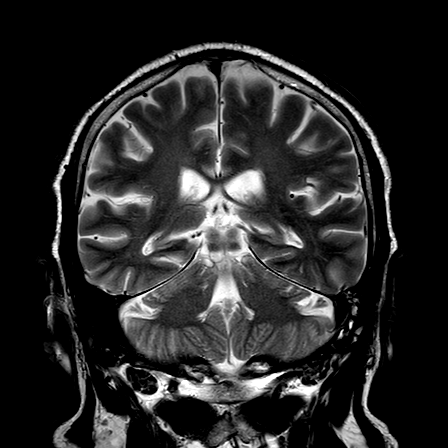
[im 29/36]
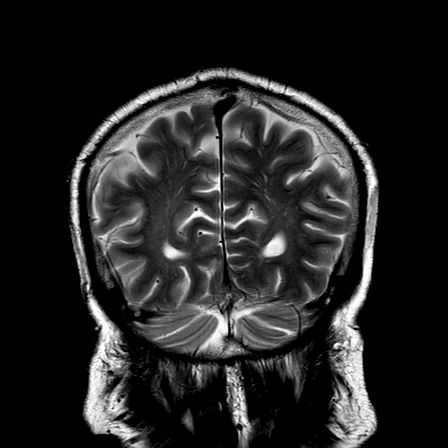
[im 36/36]
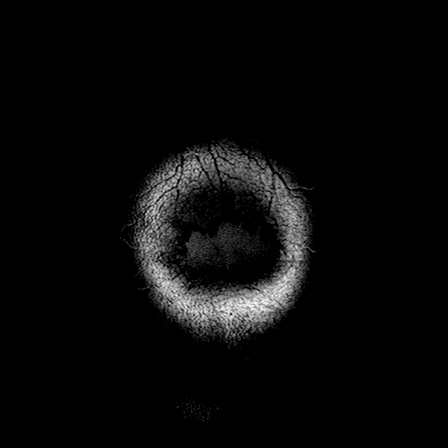

[25 of 48 positions shown; findings below may reference images not displayed]

FINDINGS: EXTRAAXIAL SPACE: Ventricles appear age appropriate and maintain midline 
position. No collections, mass lesion or significant signal abnormality. Signal 
voids of basilar and internal carotid arteries maintained. 
CEREBRUM: Superficial and deep cortical structures are well formed. No focal 
signal abnormality or mass effect present. White matter pathways including 
corpus callosum are intact. No restricted diffusion identified. Moderate 
increased signal intensity within the periventricular white matter on the FLAIR 
images without corresponding abnormal signal intensity on the diffusion-weighted 
images. Nonhemorrhagic lacunar infarct left thalamus. 
CEREBELLUM: Cerebellar hemispheres and vermis are well formed without mass 
lesion or signal abnormality. Basal cisterns are maintained. 
BRAINSTEM: Midbrain, pons, and medulla are well formed without mass or signal 
abnormality. 
SKULL/EXTRACRANIAL STRUCTURES: Mucosal thickening maxillary sinuses with small 
mucous retention cysts. Mild mucosal thickening frontal and ethmoid sinuses.. 
Small mastoid effusions slightly greater on the left than the right.  Pituitary 
fossa and orbits without definite mass.  Calvarium, visualized skull base and 
remainder of visualized upper neck appear unremarkable.
IMPRESSION: Nonhemorrhagic lacunar infarct left thalamus. 
Moderate small vessel disease. 
Mastoid effusions (slightly greater on the left than the right) but no evidence 
of coalescence.

## 2019-02-04 IMAGING — DX THORACIC SPINE 2 VIEWS
1 series · 2 of 2 positions shown · non-contrast
Comparison: CT thoracic spine dated 02/22/2018

THORACIC SPINE 2 VIEWS, 02/04/2019 [DATE]: 
CLINICAL INDICATION: Screening for MRI. Concern for retained thoracic spine 
stimulator leads

[Series 1: AP · U · 0.14mm/px · 2 of 2 slices shown]
[im 1/2]
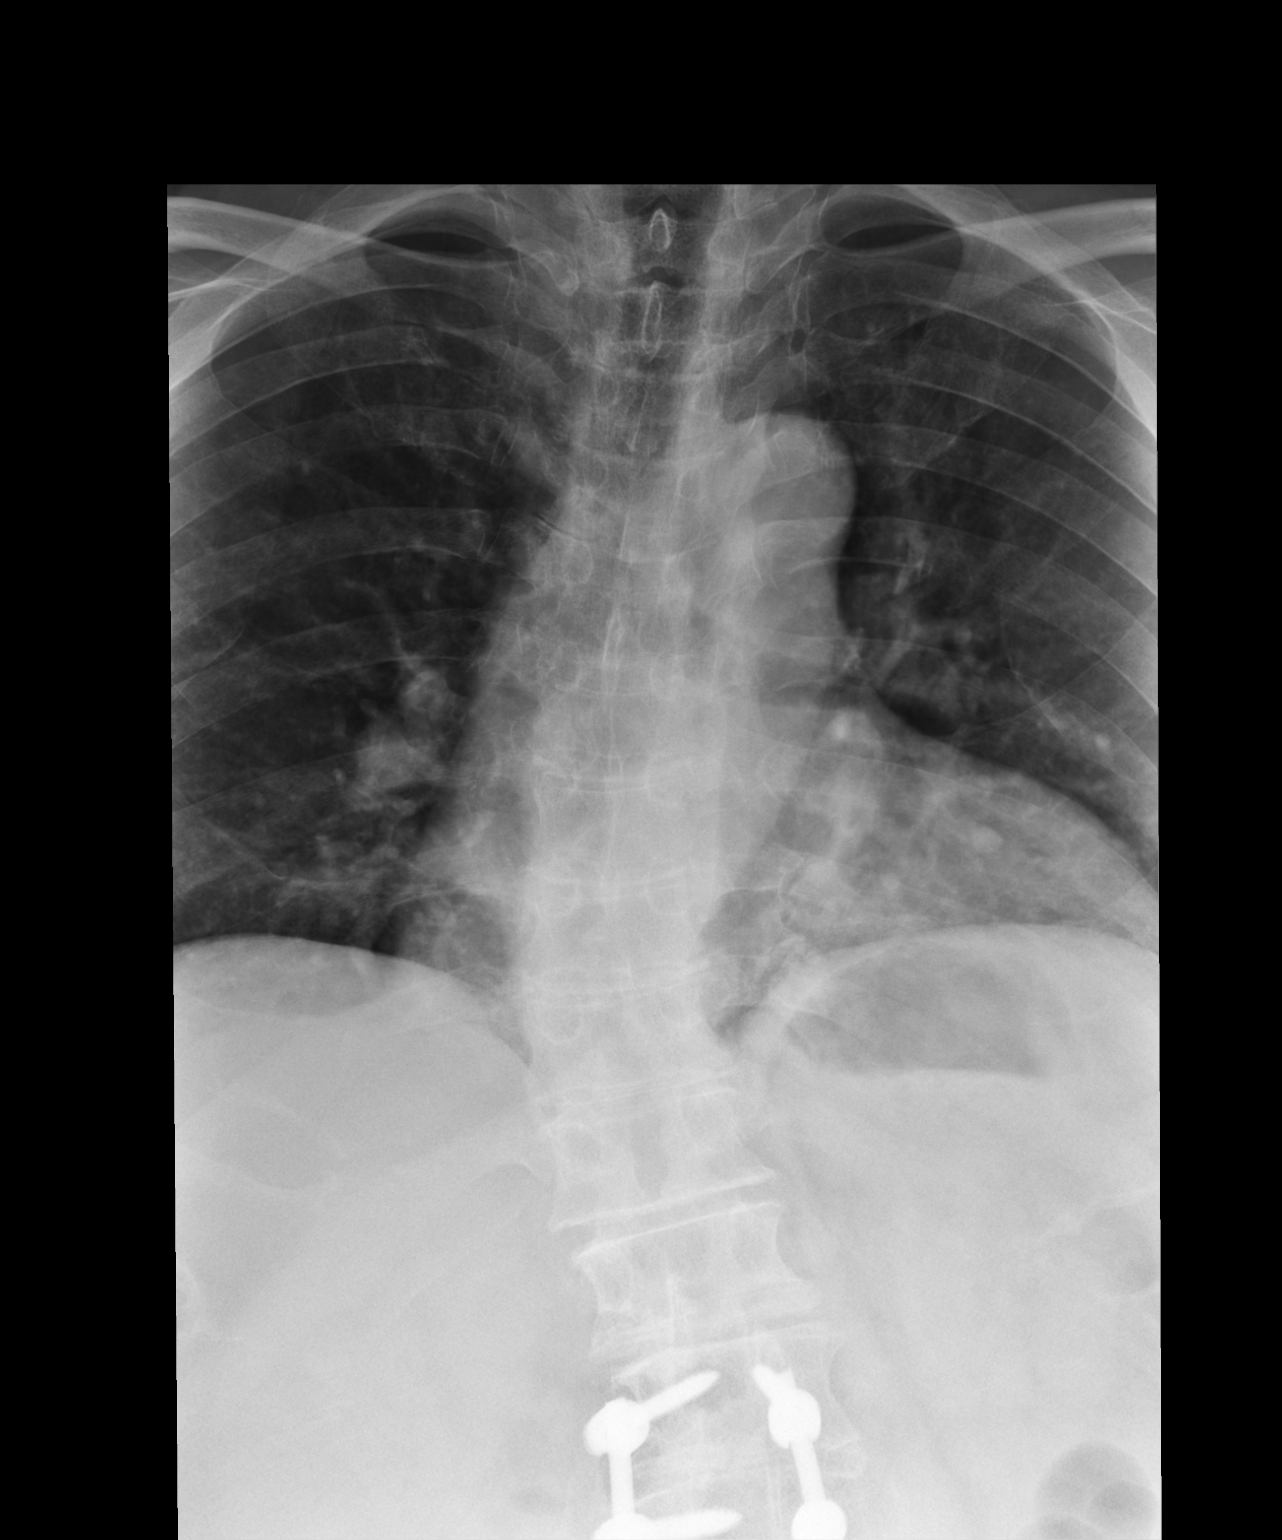
[im 2/2]
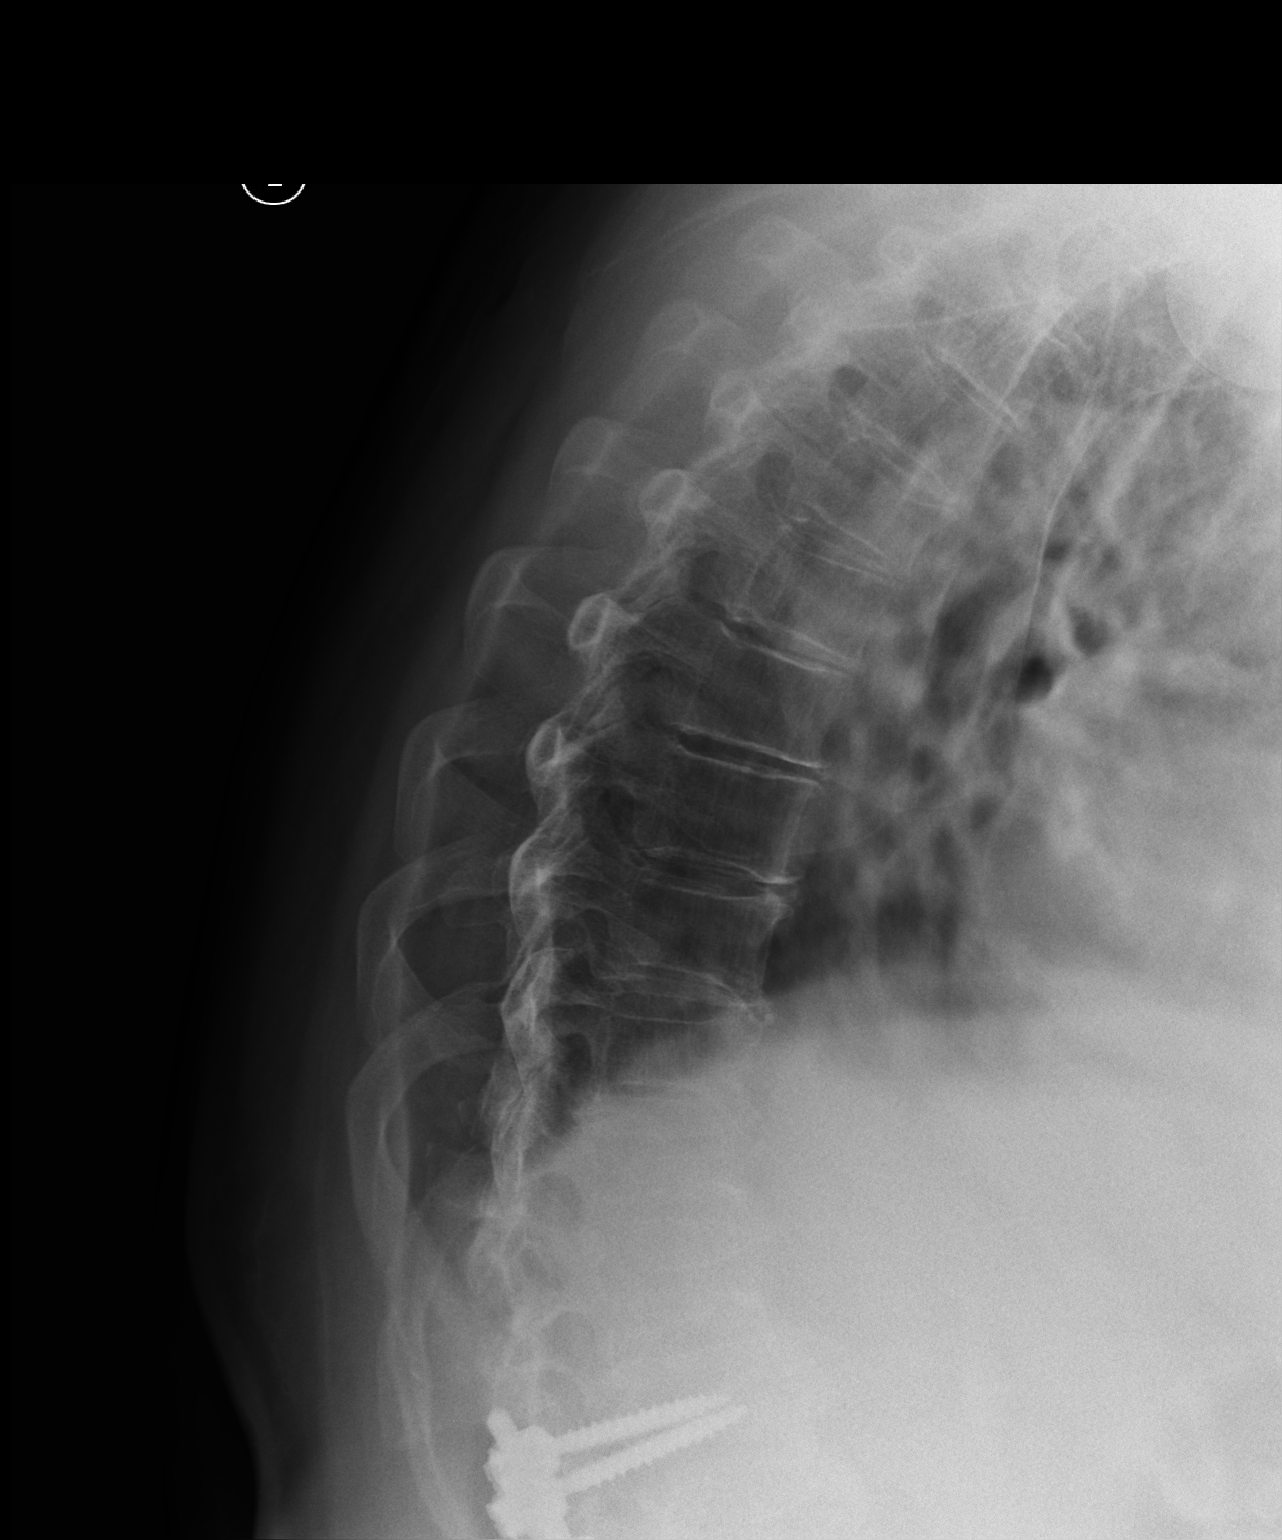

[2 of 2 positions shown; findings below may reference images not displayed]

FINDINGS: There is a rightward curvature of the thoracic spine. There is spinal 
hardware present at the level of the L2 vertebral body descending below the 
field-of-view. The previously present thoracic spine stimulator leads have been 
removed. There is no radiopaque foreign material within the thecal sac 
identified. Visualized lungs well-aerated.
IMPRESSION: 1.  Interval removal of previously present intrathecal stimulator leads. No 
radiopaque material is present within the thecal sac on today's exam. 
2.  Rightward curvature of the thoracic spine and associated degenerative 
change. 
3.  Partially visualized lumbar spine fixation hardware.

## 2020-02-18 IMAGING — NM GASTRIC EMPTYING SCAN
5 series · 10 of 10 positions shown · non-contrast
Comparison: none

GASTRIC EMPTYING SCAN, 02/18/2020 [DATE]: 
CLINICAL INDICATION:  Gaseous distention, central epigastric pain worse 
postprandially for approximately 3 months, type II diabetic for 15 years, 
decreased appetite
TECHNIQUE: The patient was given a breakfast  0.5 mCi of Technetium DDm-Qulfur 
colloid along with 4 oz of water over 6 minutes. 
Scans of the stomach were obtained as follows: Immediate, 1 hour, 2 hours, 3 
hours, and 4 hours. Results were compared to standards established by the 
consensus report from that ANMS and the MELYNDA, 3335.

[Series 1317: ant/post immed · 4.52mm/px · 2 of 2 frames shown]
[frame 1/2]
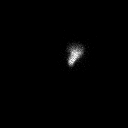
[frame 2/2]
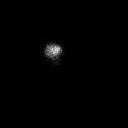

[Series 1321: ant/post 1 hr · 4.52mm/px · 2 of 2 frames shown]
[frame 1/2]
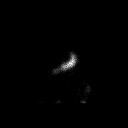
[frame 2/2]
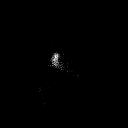

[Series 1325: ant/post 2 hr · 4.52mm/px · 2 of 2 frames shown]
[frame 1/2]
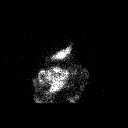
[frame 2/2]
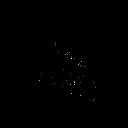

[Series 1329: ant/post 3 hr · 4.52mm/px · 2 of 2 frames shown]
[frame 1/2]
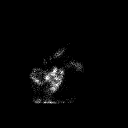
[frame 2/2]
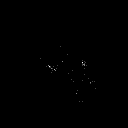

[Series 1331: ant/post 4 hr · 4.52mm/px · 2 of 2 frames shown]
[frame 1/2]
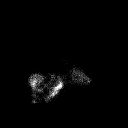
[frame 2/2]
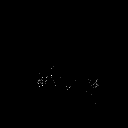

[10 of 10 positions shown; findings below may reference images not displayed]

FINDINGS: TIME                    EMPTYING                          NORMAL 
     1 hour                     40%                             (< 30%) 
     2 hours                   76%                             (> 60%) 
     3 hours                   85%                              (> 70%) 
     2hours                    95%                              (> 90%)
IMPRESSION: Slightly rapid gastric emptying at 1 hour. Normal gastric emptying at 2, 3, and 
4 hours.

## 2020-07-30 IMAGING — NM HEPATOBILIARY SCAN WITH SINCALIDE
3 series · 24 of 24 positions shown · non-contrast
Comparison: none

HEPATOBILIARY SCAN WITH SINCALIDE, 07/30/2020 [DATE]: 
CLINICAL INDICATION:  Irritable bowel syndrome, GERD symptoms, dry cough, loose 
stools
TECHNIQUE: The patient was injected with 7.9 mCi of technetium 99m Choletec. 
Imaging of the right upper quadrant were then performed.  2.2 mcg of CCK was 
injected intravenously and a stimulated gallbladder ejection fraction was 
performed.

[Series 1000: hida 0-(id) · 3.90mm/px · 6 of 60 frames shown (1 of 2)]
[frame 6/60]
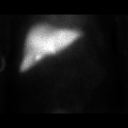
[frame 16/60]
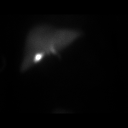
[frame 26/60]
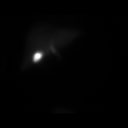
[frame 36/60]
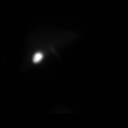
[frame 46/60]
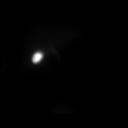
[frame 56/60]
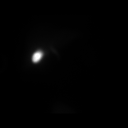

[Series 1000: hepatobiliary ef · 3.90mm/px · 6 of 30 frames shown]
[frame 3/30]
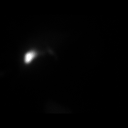
[frame 8/30]
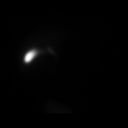
[frame 13/30]
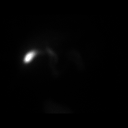
[frame 18/30]
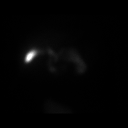
[frame 23/30]
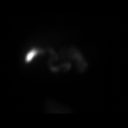
[frame 28/30]
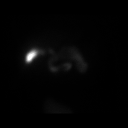

[Series 1000: hida 0-(id) · 1.95mm/px · 12 of 12 slices shown (2 of 2)]
[im 1/12]
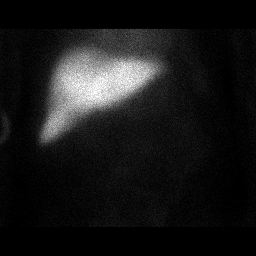
[im 2/12]
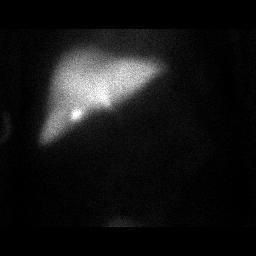
[im 3/12]
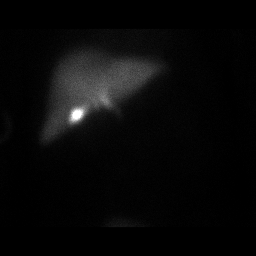
[im 4/12]
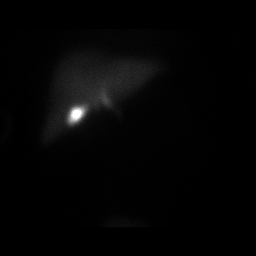
[im 5/12]
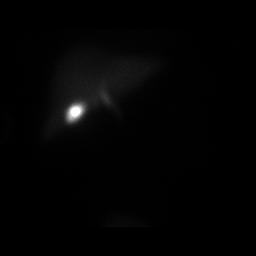
[im 6/12]
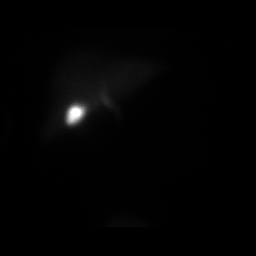
[im 7/12]
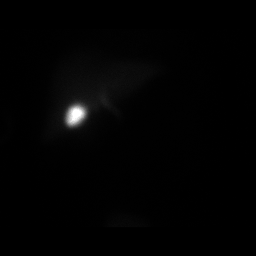
[im 8/12]
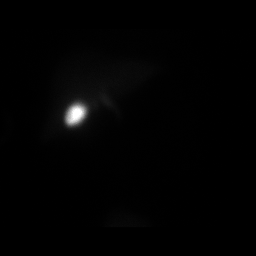
[im 9/12]
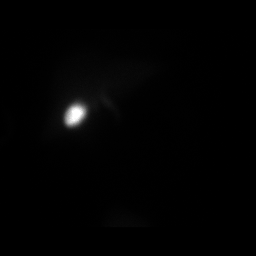
[im 10/12]
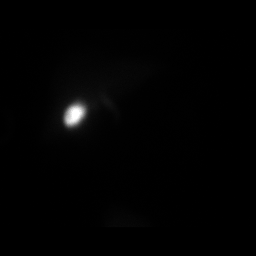
[im 11/12]
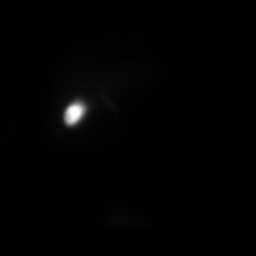
[im 12/12]
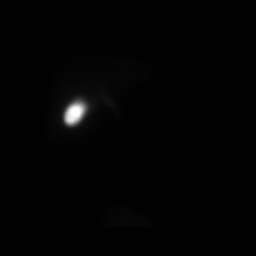

[24 of 24 positions shown; findings below may reference images not displayed]

FINDINGS: Dynamic cine images demonstrate normal uptake by the liver with normal excretion 
into the biliary tree, visualization of the gallbladder, common duct, and small 
bowel. 
Calculated gallbladder ejection fraction is 36% at 20 minutes, 29% at 30 
minutes. Normal is greater than 35% by 20 minutes.
IMPRESSION: Normal exam.

## 2020-12-11 IMAGING — MR MRI THORACIC SPINE W/WO CONTRAST
6 of 11 series · 18 of 48 positions shown · IV contrast (gadolinium)
Comparison: 02/04/2019 radiographs

MRI THORACIC SPINE W/WO CONTRAST, 12/11/2020 [DATE]: 
CLINICAL INDICATION: Low back pain with right-sided lower extremity 
radiculopathy.
TECHNIQUE: Multiplanar, multiecho position MR images of the thoracic spine were 
performed without and with intravenous gadolinium enhancement.  10 ml of 
Gadavist were injected intravenously by hand.

[Series 101: survey · axial · 10.0mm · 1.67mm/px · z∈[-30,+199]mm · 2 of 15 slices shown]
[im 1/15]
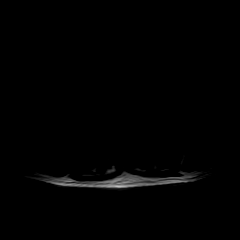
[im 15/15]
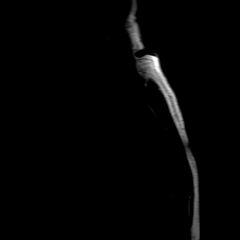

[Series 201: t2w_cor-surv · coronal · 10.0mm · 0.88mm/px · 3 of 20 slices shown]
[im 1/20]
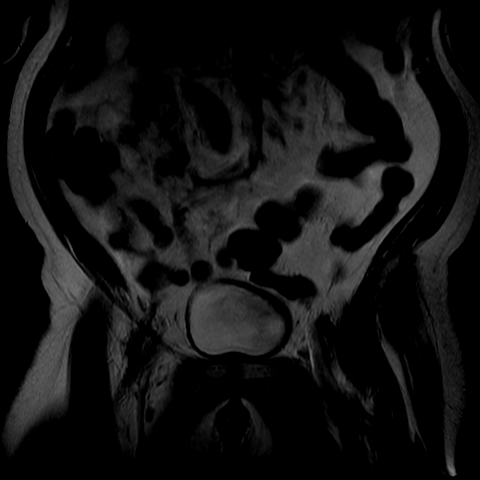
[im 10/20]
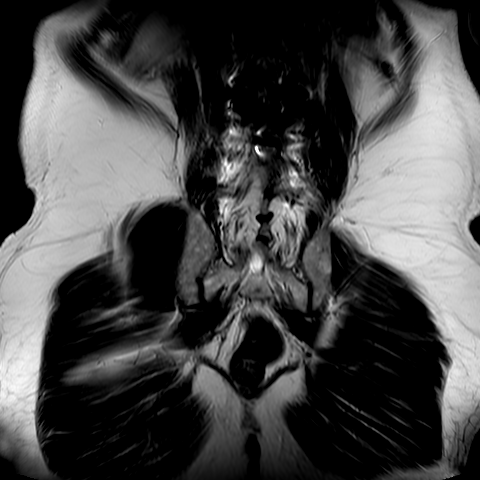
[im 20/20]
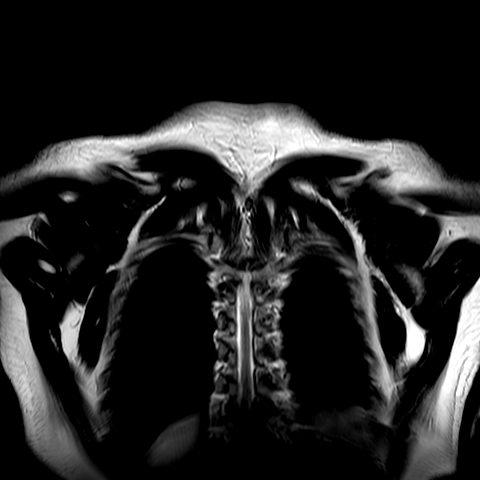

[Series 303: T1 · sagittal · 5.5mm · 0.66mm/px · 3 of 15 slices shown]
[im 1/15]
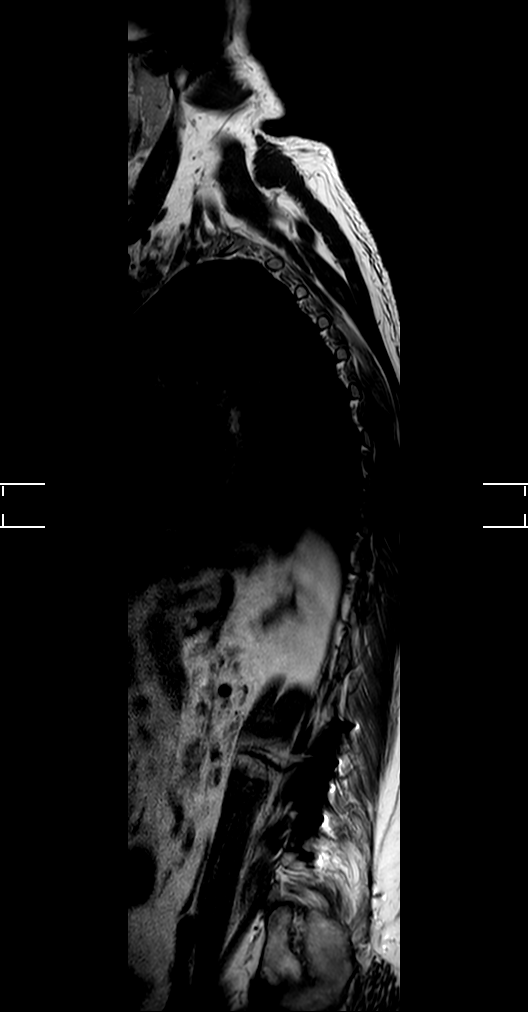
[im 8/15]
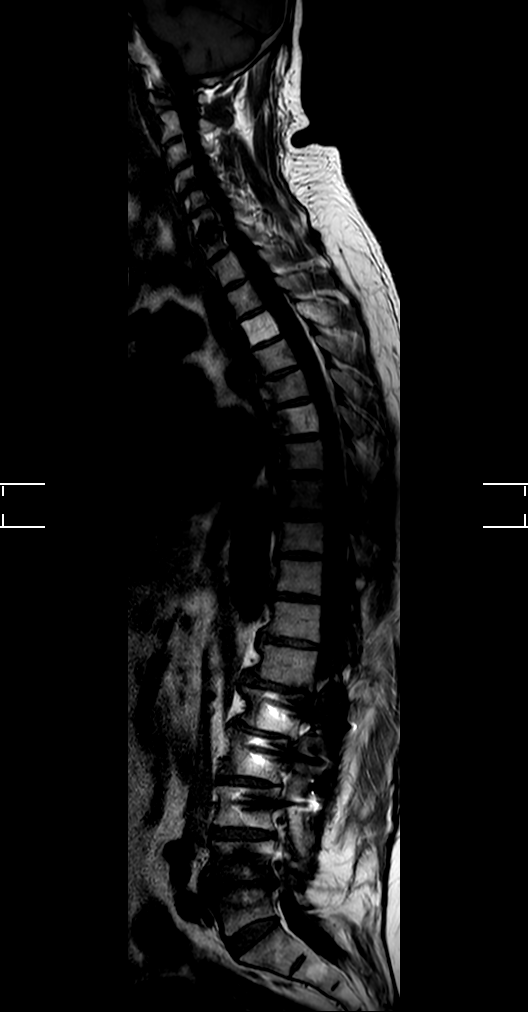
[im 15/15]
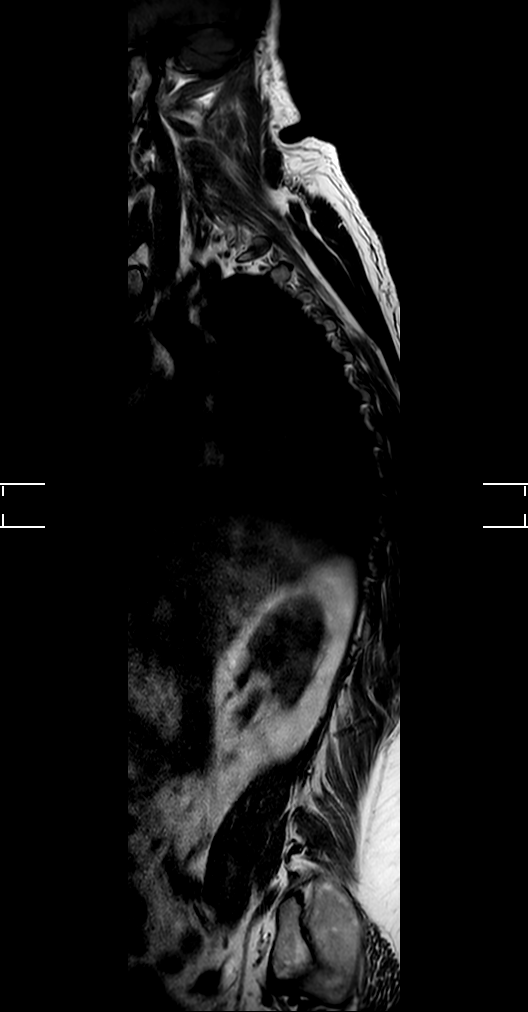

[Series 401: t1_tse_sag · sagittal · 3.0mm · 0.62mm/px · 4 of 21 slices shown]
[im 1/21]
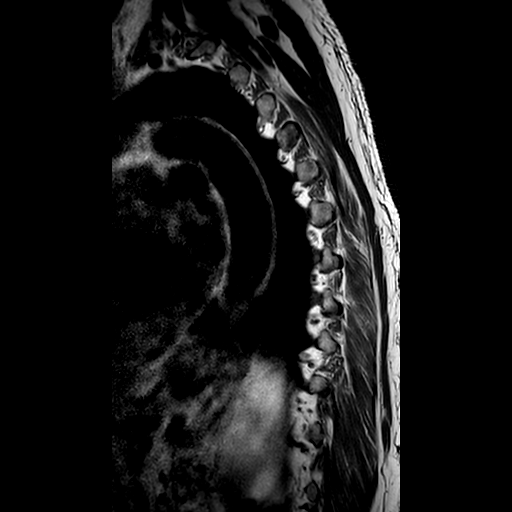
[im 7/21]
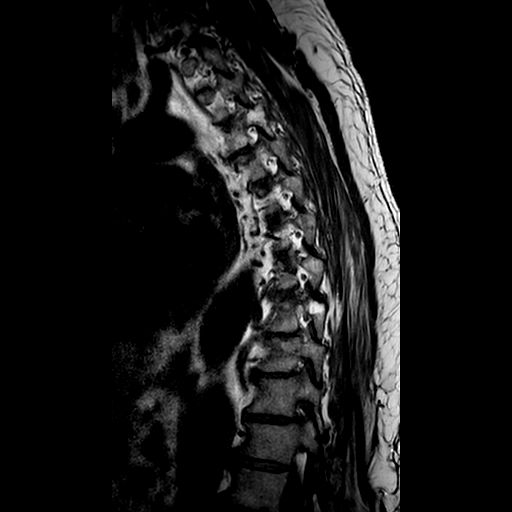
[im 14/21]
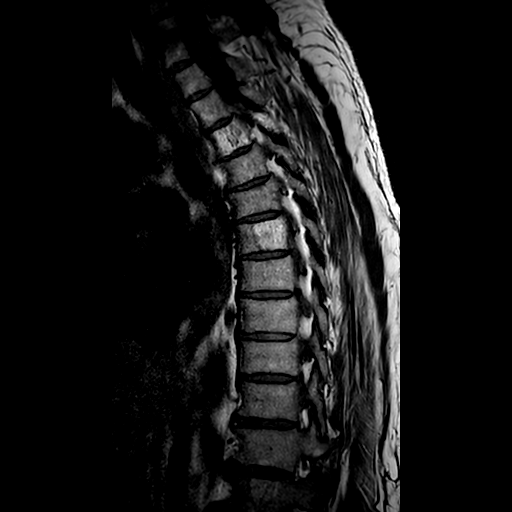
[im 21/21]
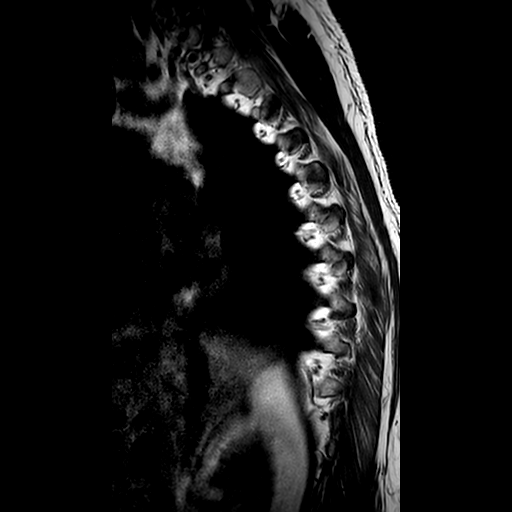

[Series 501: t2_tse_sag · sagittal · 3.0mm · 0.62mm/px · 4 of 21 slices shown]
[im 1/21]
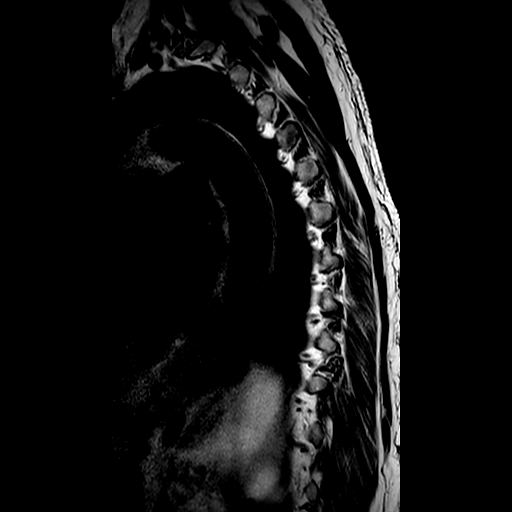
[im 7/21]
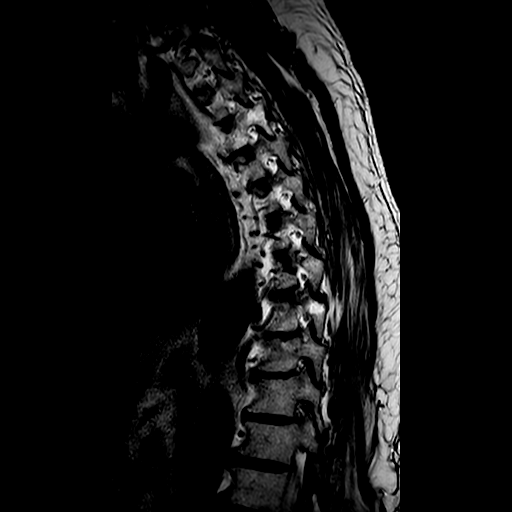
[im 14/21]
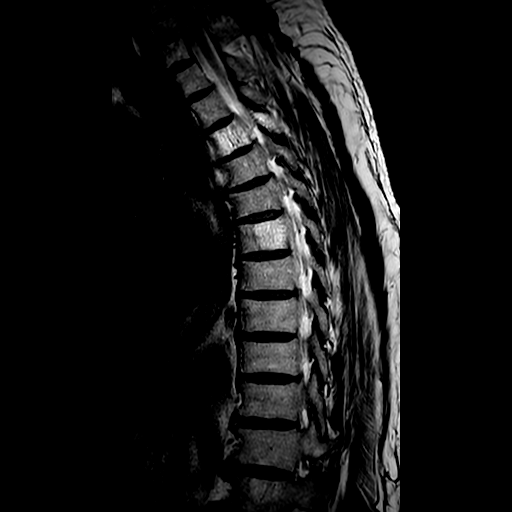
[im 21/21]
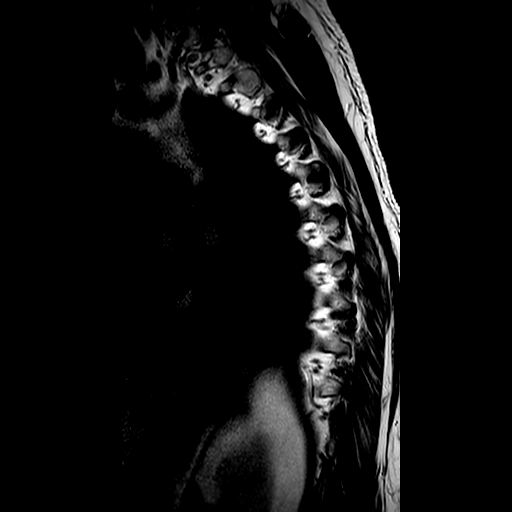

[Series 601: stir_sag · sagittal · 3.0mm · 0.62mm/px · 2 of 21 slices shown]
[im 1/21]
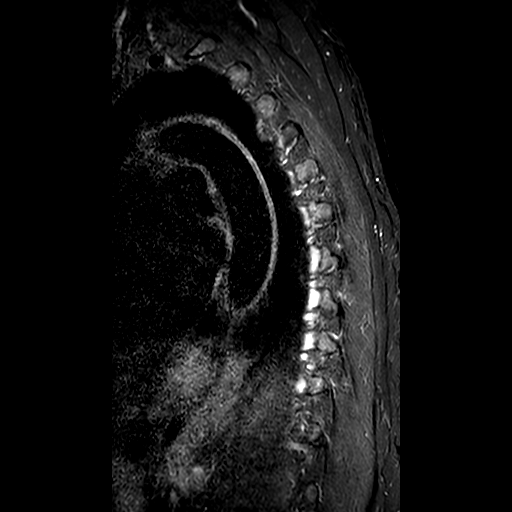
[im 7/21]
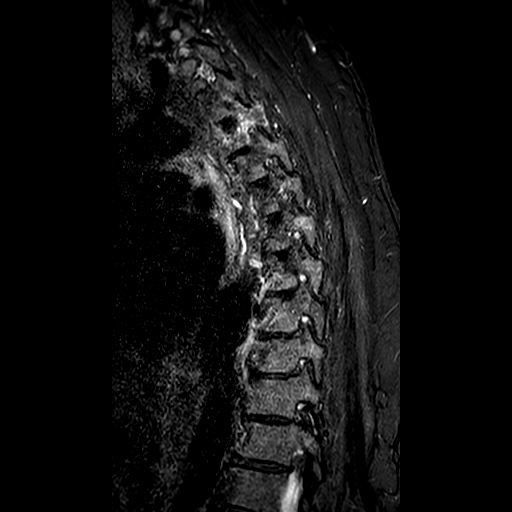

[18 of 48 positions shown; findings below may reference images not displayed]

FINDINGS: VERTEBRA: No fractures or marrow replacing lesions. Several vertebral body 
hemangiomas, most marked involving the T3 and T6 vertebrae. No central canal 
stenosis. Mild multilevel neural foraminal stenosis. No abnormal enhancement. 
DISCS: Multilevel degenerative disc disease. Small T7-8 right paracentral disc 
protrusion. Left paracentral disc protrusions at T8-9 through T10-11. 
SPINAL CORD: Spinal cord is normal in caliber and signal intensity. Conus 
medullaris is at the level of L1. No abnormal enhancement. 
OTHER: Localizer view demonstrates degenerative change of the cervical spine, 
most marked at C6-7. 2 mm posterior positioning of C6 upon C7, C6-7 right 
foraminal disc protrusion (superimposed upon broad-based disc bulge), marked 
disc space narrowing, mild central canal and marked neural foraminal stenosis. 
L1-L4 pedicle screw fixation and posterior decompression. Lumbar degenerative 
change, most marked at L4-5.
IMPRESSION: 1.  Multifocal degenerative change and mild multilevel neural foraminal 
stenosis. 
2.  Small T7-8 right paracentral disc protrusion. Left paracentral disc 
protrusions at T8-9 through T10-11. 
3.  Multilevel degenerative change of the cervical spine and degenerative/post 
surgical change of the lumbar spine.

## 2021-05-16 IMAGING — MR MRI LUMBAR SPINE WITHOUT CONTRAST
4 of 7 series · 17 of 48 positions shown · IV contrast (gadolinium)
Comparison: CT examination of the lumbar spine of 08/29/2017.

________________________________________________________________________________________________ 
MRI LUMBAR SPINE WITHOUT CONTRAST, 05/16/2021 [DATE]: 
CLINICAL INDICATION: Lumbar ago. Chronic low back pain with right leg numbness. 
History of surgery x6. Patient states has no mental in the lower back, it was 
removed.
TECHNIQUE: Multiplanar, multiecho position MR images of the lumbar spine were 
performed without intravenous gadolinium enhancement. Patient was scanned on a 
3T magnet. Metallic suppression was applied.

[Series 101: survey · axial · 10.0mm · 1.39mm/px · z∈[-15,+199]mm · 2 of 9 slices shown]
[im 1/9]
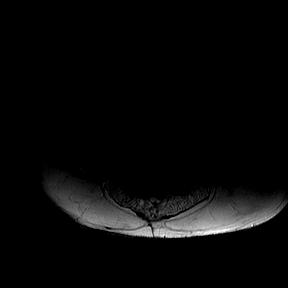
[im 9/9]
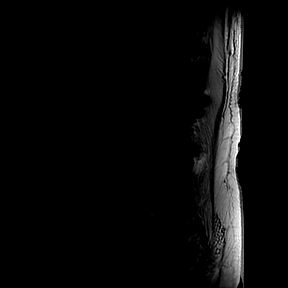

[Series 201: t2w_cor-surv · coronal · 6.0mm · 0.50mm/px · 2 of 5 slices shown]
[im 1/5]
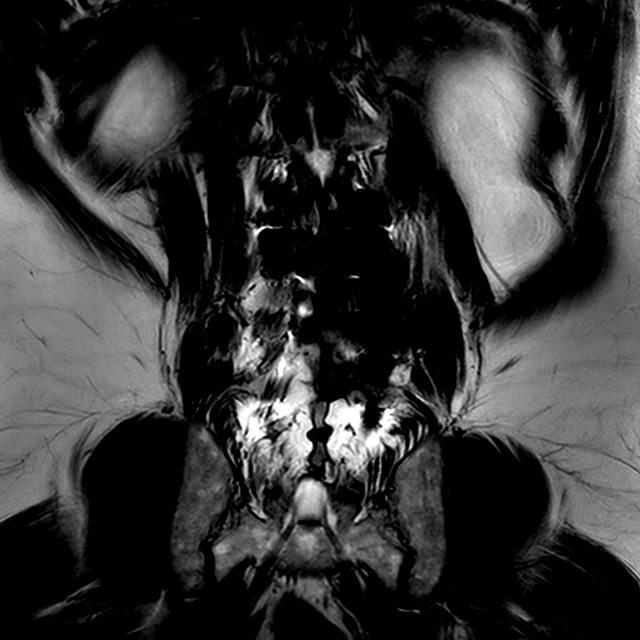
[im 5/5]
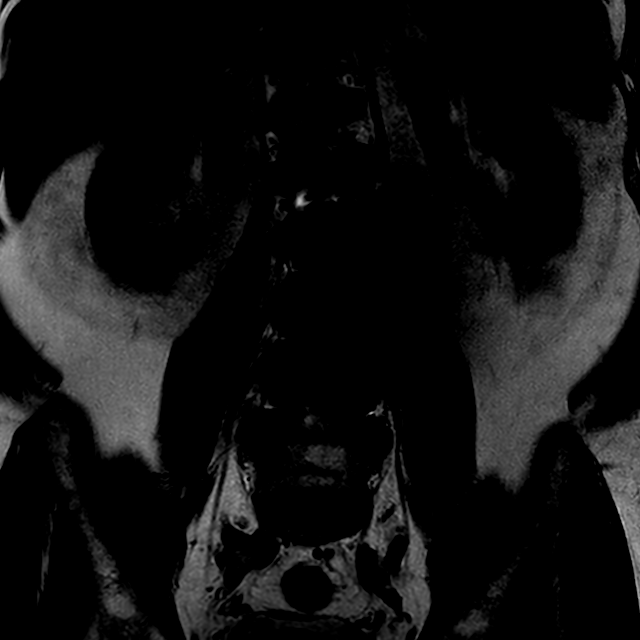

[Series 301: (person_name)1(person_name)_(person_name) · sagittal · 4.5mm · 0.42mm/px · 4 of 17 slices shown]
[im 1/17]
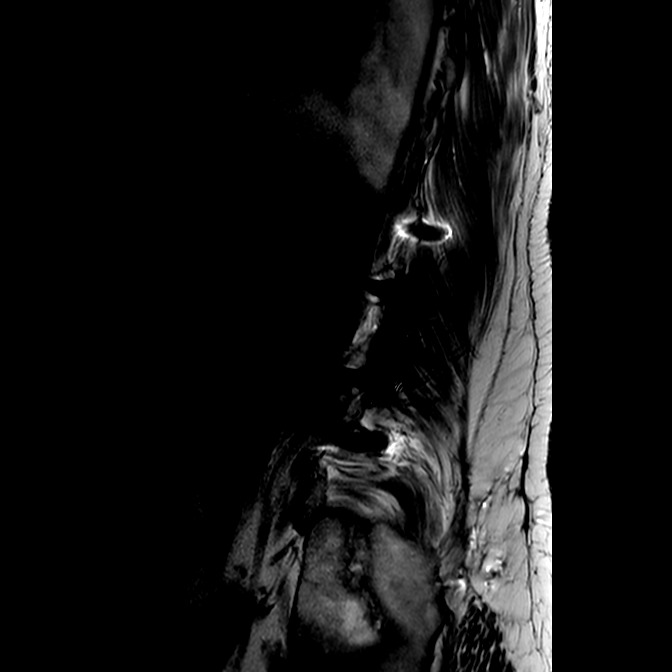
[im 4/17]
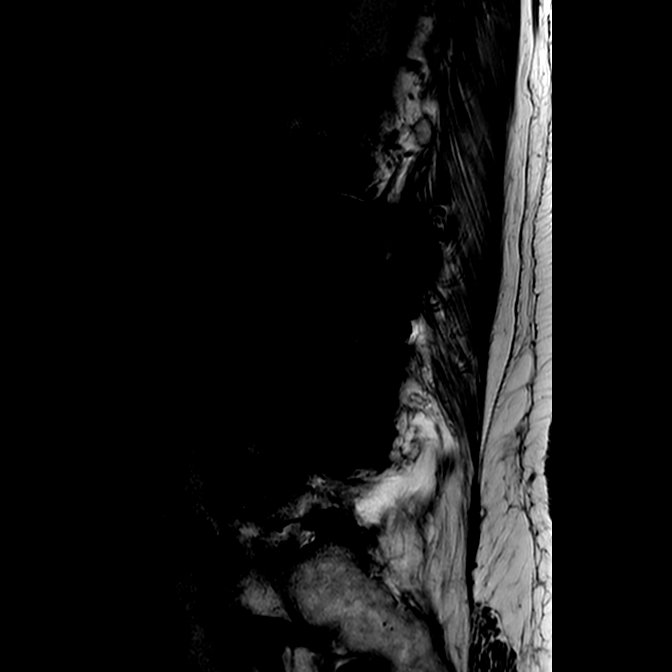
[im 10/17]
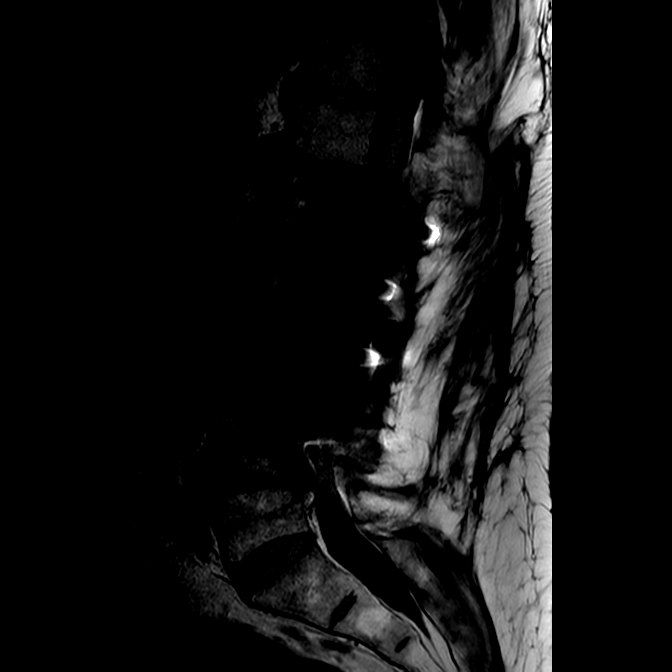
[im 17/17]
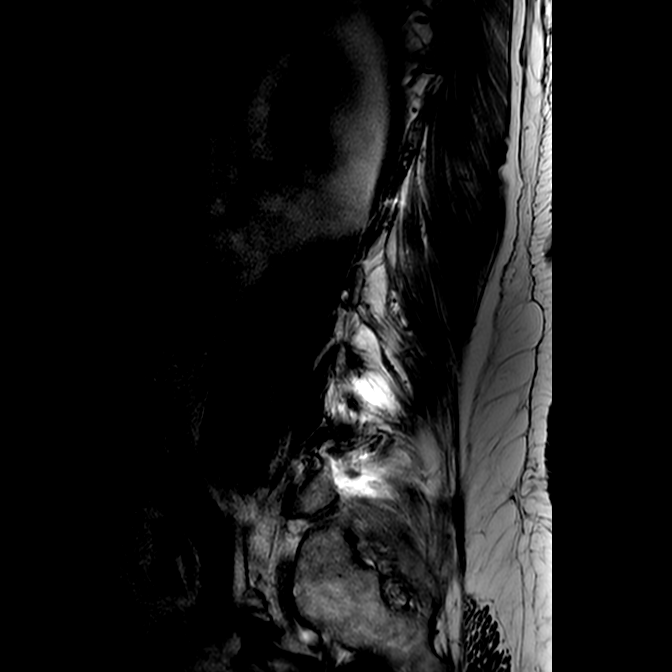

[Series 701: T1 · axial · 4.0mm · 0.38mm/px · z∈[-113,+99]mm · 9 of 42 slices shown]
[im 1/42]
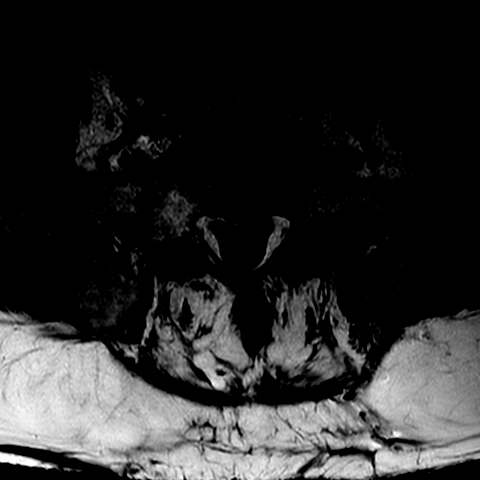
[im 6/42]
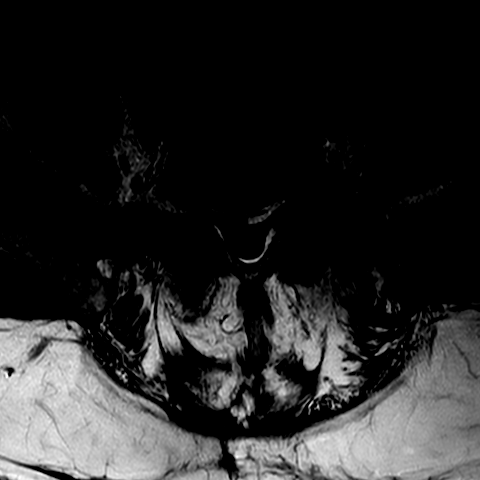
[im 12/42]
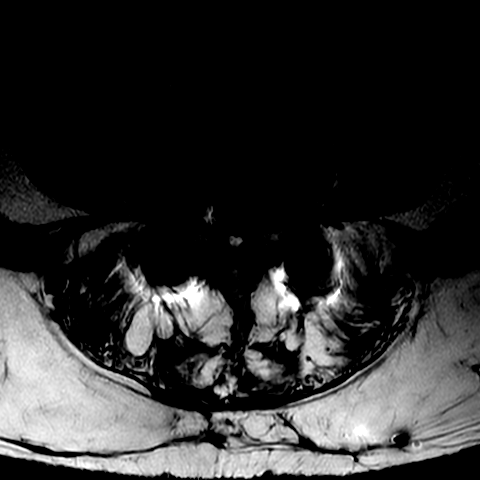
[im 18/42]
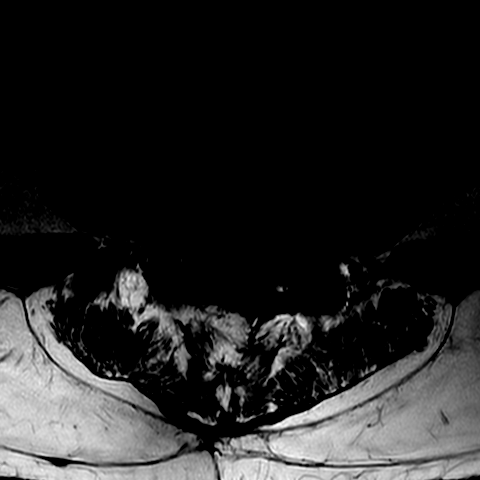
[im 21/42]
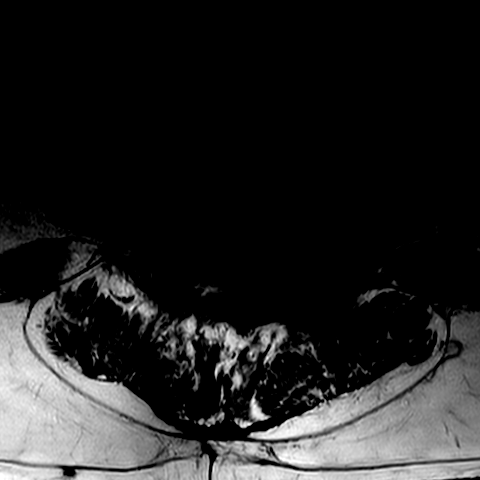
[im 24/42]
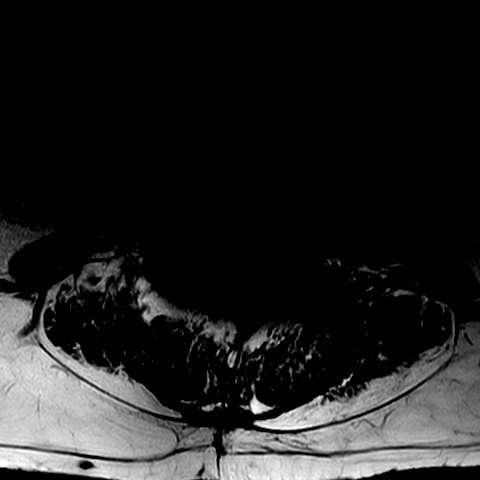
[im 30/42]
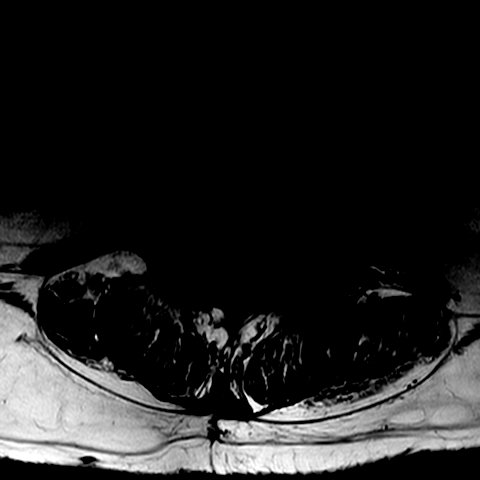
[im 36/42]
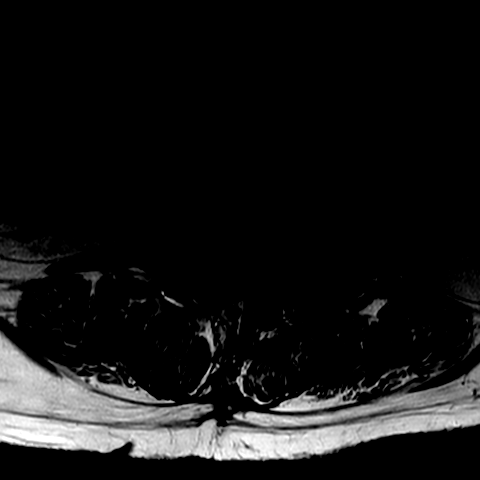
[im 42/42]
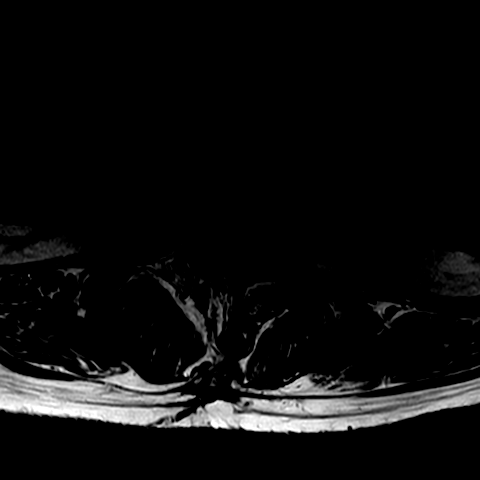

[17 of 48 positions shown; findings below may reference images not displayed]

FINDINGS: There are riblets of the T12 level. There is metallic susceptibility 
artifact about the previously seen transpedicular screw and rod fixation L1-L4. 
This obscures much of the detail of the exam. Trace retrolisthesis L4 on L5 is 
unchanged. No acute vertebral body fracture. Lumbar levocurvature. Distal cord 
and cauda equina are predominantly obscured. Moderate degenerative changes of 
the SI joints are partially included. Diffuse atrophy/fatty infiltration of the 
posterior paraspinal musculature of the lower lumbar spine. Renal cysts are 
suggested only partially included. 
Modic I-II: Most levels are obscured by metallic susceptibility artifact. There 
is Modic 1-2 at L4-L5. 
Ligamentum Flavum > 2.5 mm: Most levels are obscured by metallic susceptibility 
artifact. There is ligamentum flavum thickening at L4-L5. 
T12-L1: Diffuse disc desiccation and disc height loss. Mild dorsal disc 
osteophyte complex. Mild right neural foraminal stenosis. 
L1-L2: Obscured by metallic susceptibility artifact. 
L2-L3: Obscured by metallic susceptibility artifact. 
L3-L4: Obscured by metallic susceptibility artifact. 
L4-L5: Trace retrolisthesis L4 on L5 with diffuse disc desiccation and disc 
height loss and dorsal disc osteophyte complex. Mild to moderate facet 
hypertrophy. Severe bilateral neural foraminal stenosis. Prominence of the 
dorsal epidural fat. There is severe spinal canal stenosis with the thecal sac 
narrowed to 5 mm AP with effacement of CSF about the traversing cauda equina. 
L5-S1: Mild disc desiccation and dorsal disc height loss. No disc herniation. 
Mild facet hypertrophy. Moderate left and mild right neural foraminal stenosis.
IMPRESSION: 1.  Lumbar levocurvature with postsurgical changes with associated metallic 
susceptibility artifact obscuring much of the detail of the exam. 
2.  Severe spinal canal and neural foraminal stenoses at L4-L5 with Modic 1-2 
endplate marrow signal endplate changes and trace retrolisthesis.

## 2021-06-02 ENCOUNTER — Telehealth: Admit: 2021-06-02 | Payer: PRIVATE HEALTH INSURANCE | Attending: Orthopedic Surgery | Primary: Adult Health

## 2021-06-02 NOTE — Telephone Encounter
Per MD:  Advised patient to call back with a fax number so our office can send previous medical records regarding patient questions.

## 2021-06-14 IMAGING — DX LUMBAR SPINE 2 VIEW
1 series · 2 of 2 positions shown · non-contrast
Comparison: May 16, 2021 lumbar MRI

________________________________________________________________________________________________ 
LUMBAR SPINE 2 VIEW, 06/14/2021 [DATE]: 
CLINICAL INDICATION:  Lumbar stenosis

[Series 1: lateral · U · 0.14mm/px · 2 of 2 slices shown]
[im 1/2]
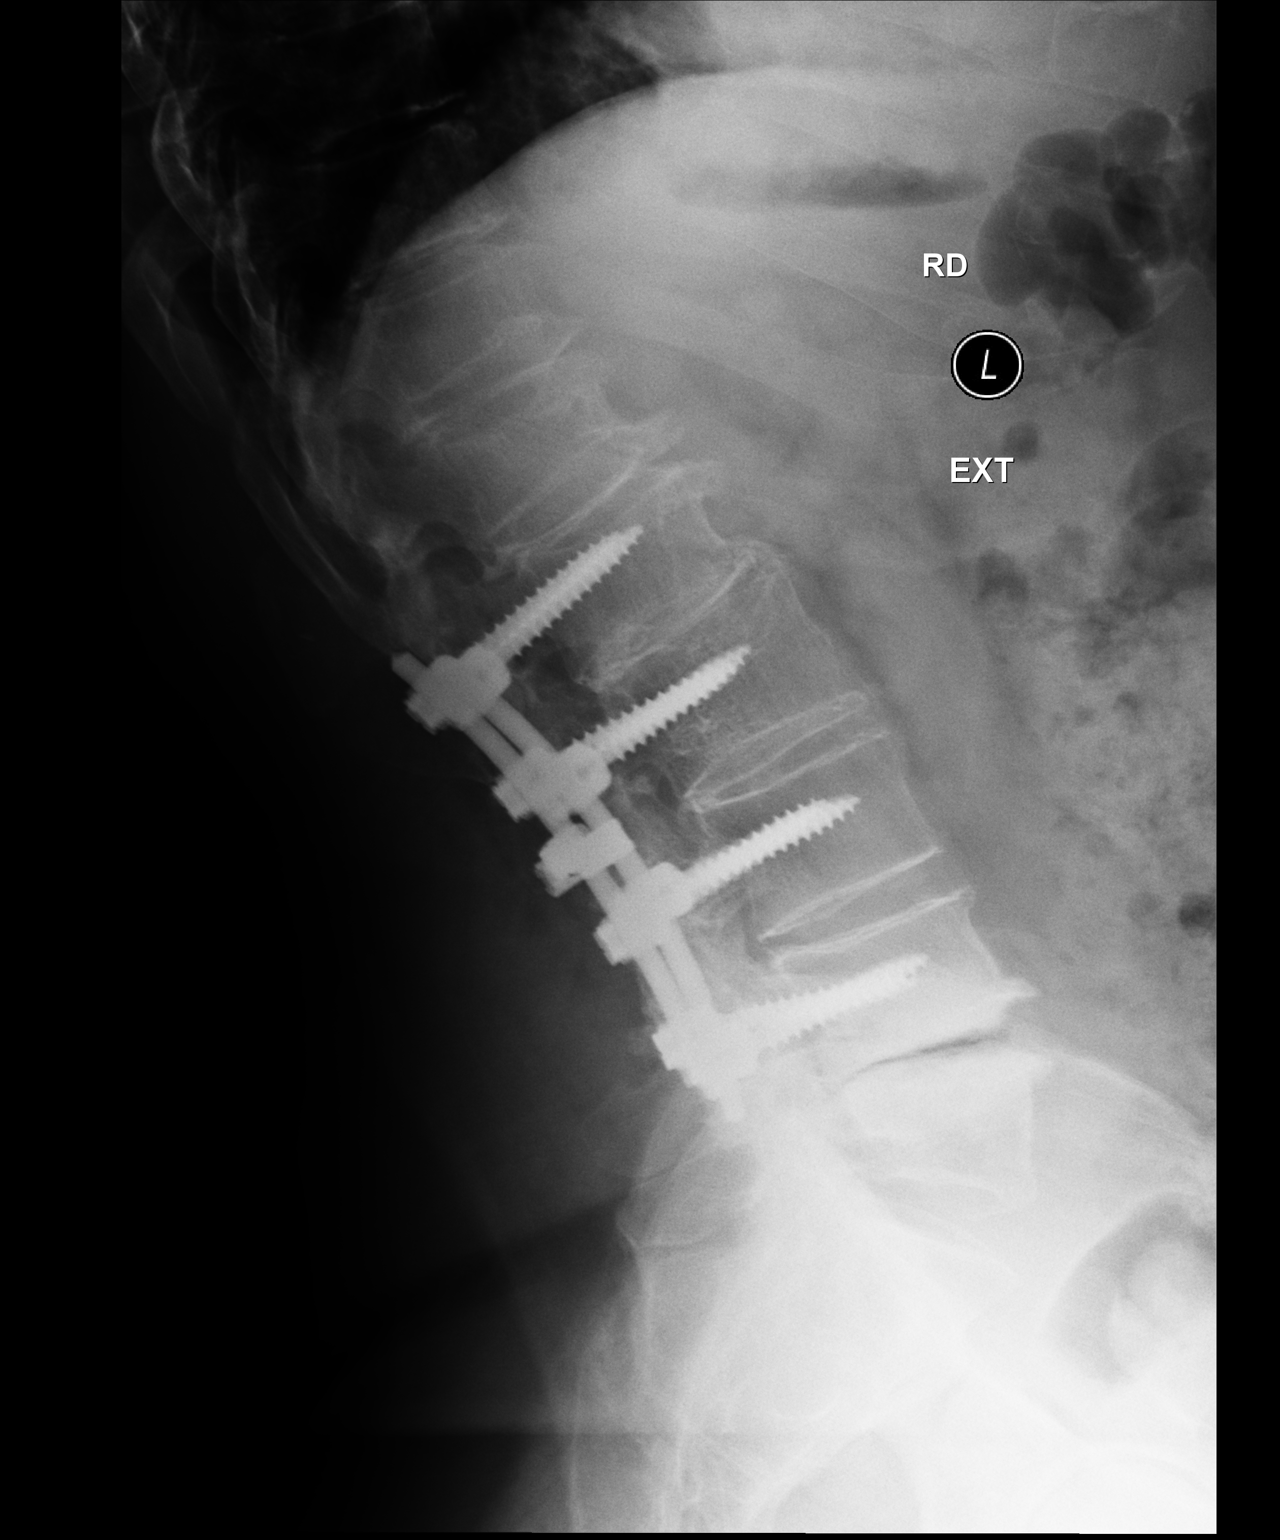
[im 2/2]
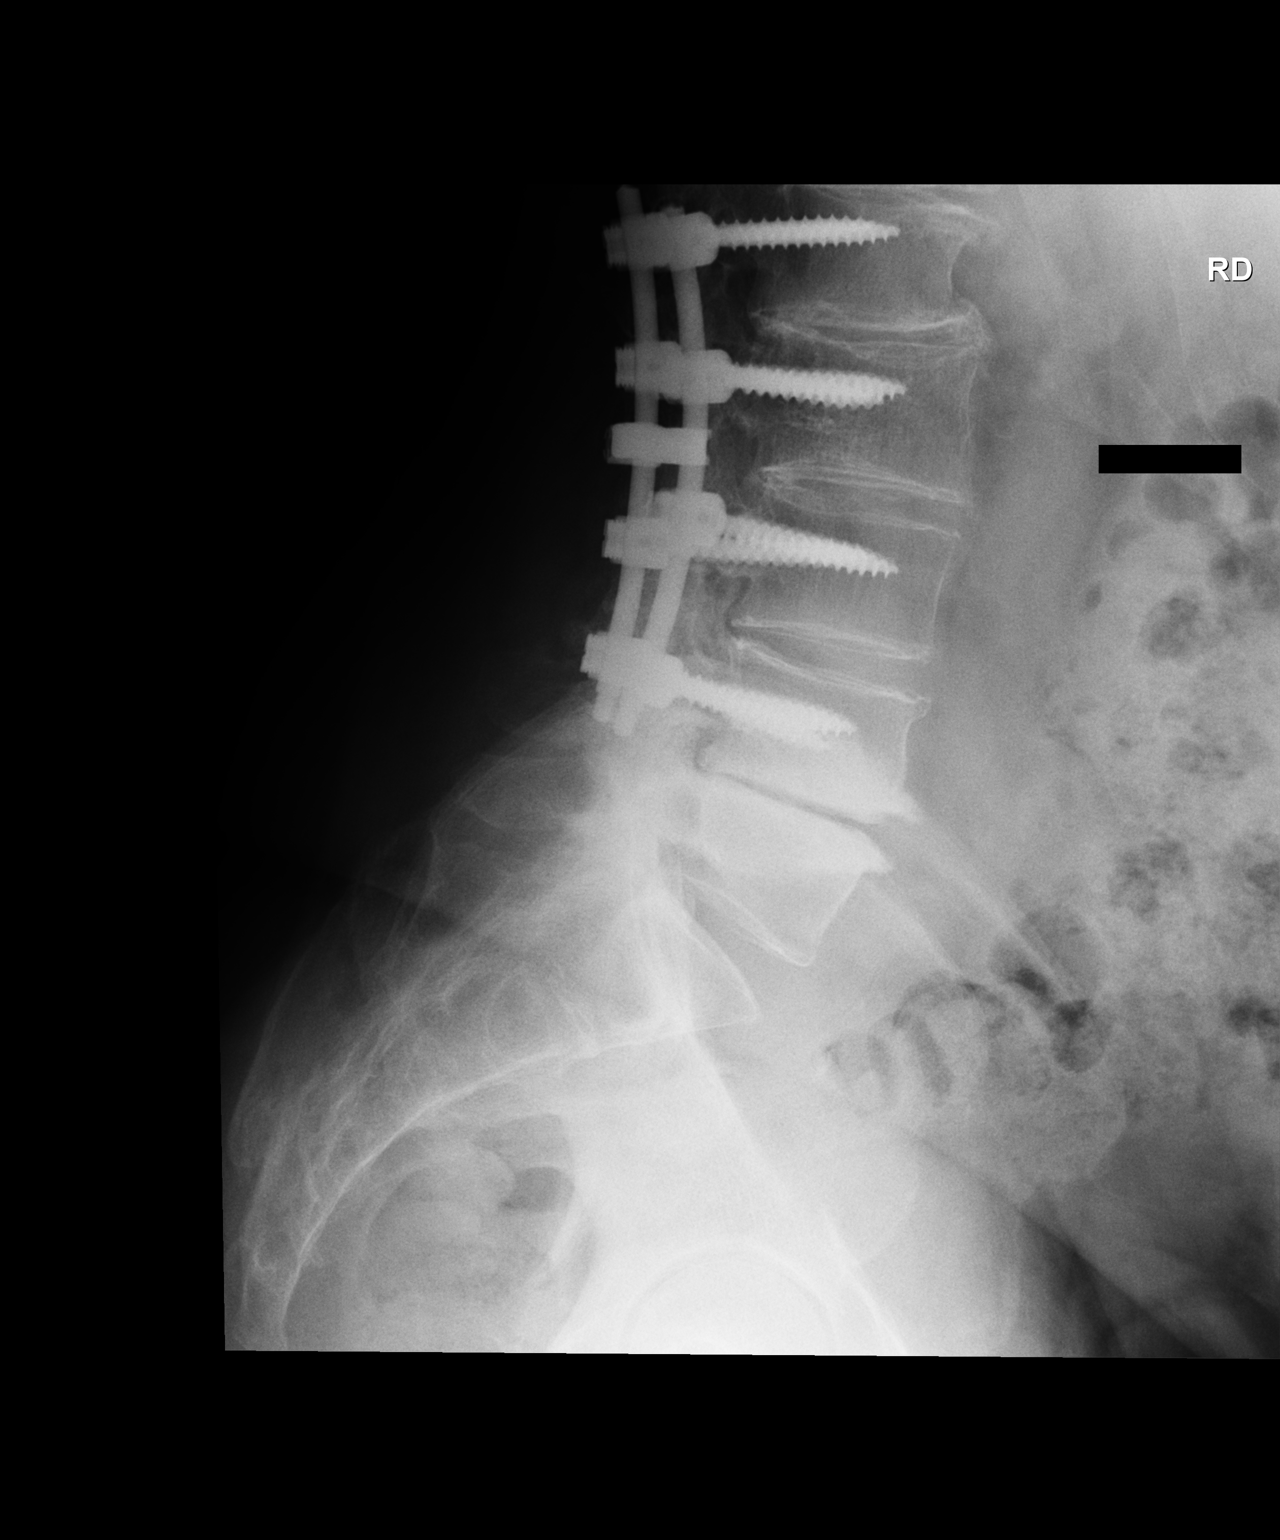

[2 of 2 positions shown; findings below may reference images not displayed]

FINDINGS: There is flexion and extension lateral views were obtained. There is 
no evidence for hardware mobility. No hardware failure evident. Marked disc 
narrowing at L4-5. There appears to be mild loss of height at T12. This was not 
apparent on the May 16, 2021 lumbar MRI.
IMPRESSION: No hardware mobility or dynamic subluxation evident. 
Loss of height at T12 was not present on the May 16, 2021 MRI. Correlation 
with AP radiograph including the T12 level would be useful. If there is clinical 
concern for interval compression fracture, follow-up MRI can be obtained.

## 2021-11-09 IMAGING — DX LUMBAR SPINE 2 VIEW BENDING VIEWS ONLY
1 series · 3 of 3 positions shown · non-contrast
Comparison: none

________________________________________________________________________________________________ 
LUMBAR SPINE 2 VIEW BENDING VIEWS ONLY, 11/09/2021 [DATE]: 
CLINICAL INDICATION: Lumbar spondylosis, chronic back pain
TECHNIQUE: Neutral lateral, lateral flexion and extension views only

[Series 1: lateral · U · 0.14mm/px · 3 of 3 slices shown]
[im 1/3]
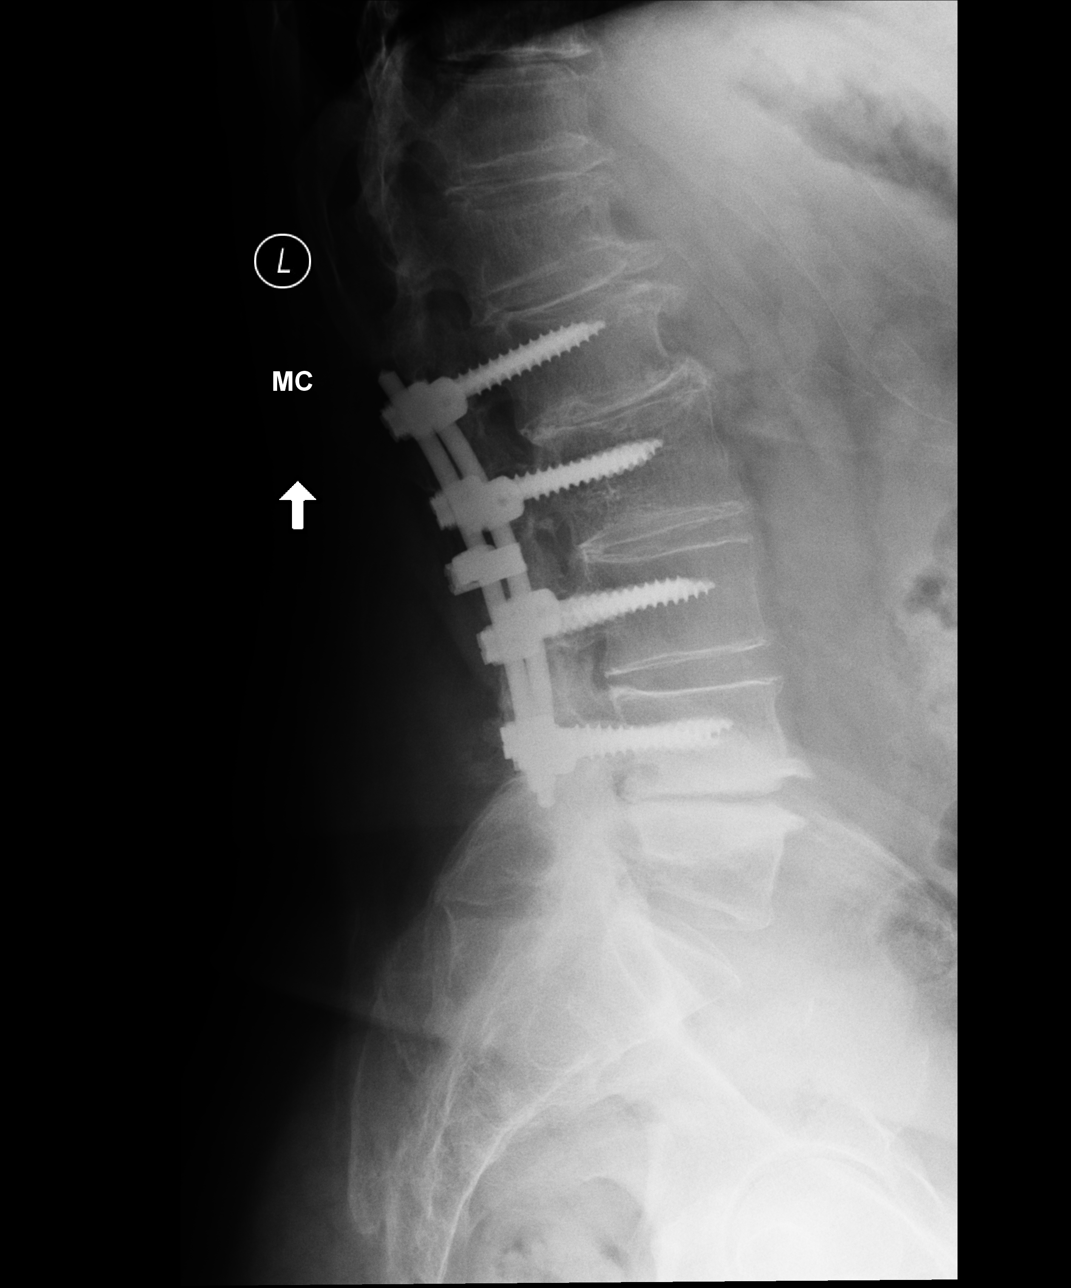
[im 2/3]
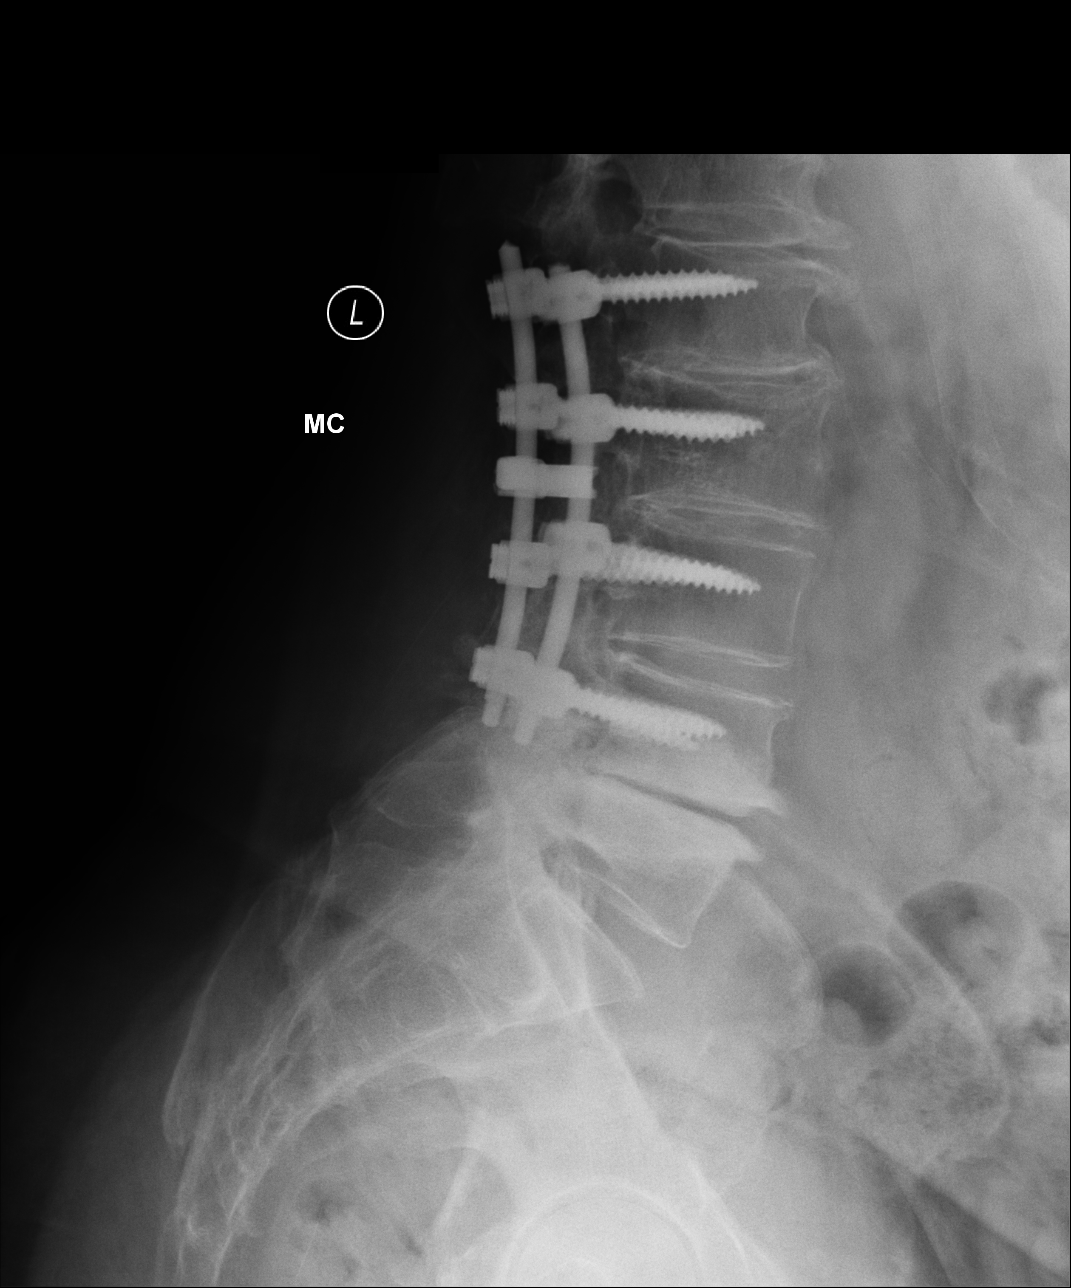
[im 3/3]
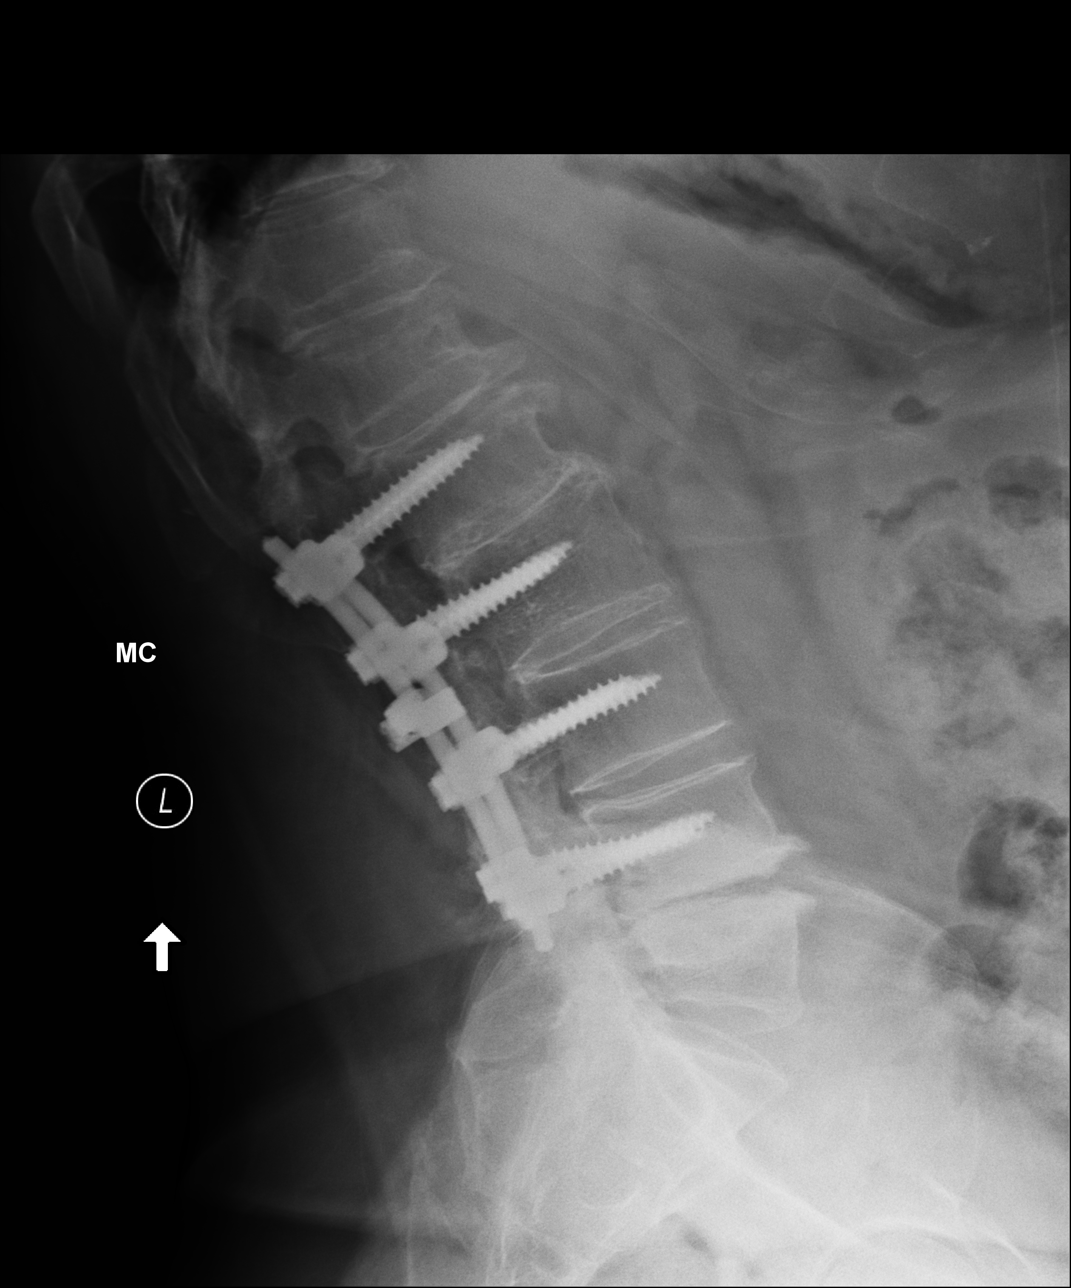

[3 of 3 positions shown; findings below may reference images not displayed]

FINDINGS: Patient is status post L1-L4 pedicle screw fusion. Hardware is 
unchanged in position compared to the prior radiograph. Flexion-extension views 
show no hardware mobility. No evidence for failure. No AP view obtained. 
There is no evidence for recent fracture. There are degenerative changes, 
including marked disc narrowing and endplate sclerosis at L4-5.
IMPRESSION: Stable interval exam. No evidence for hardware failure or mobility. Degenerative 
changes, most pronounced at L4-5. The May 2021 MRI showed a high-grade canal 
stenosis at L4-5.

## 2021-11-25 IMAGING — MR MULTI PARAMETRIC MRI PROSTATE W/O AND W
13 series · 48 of 48 positions shown · IV contrast (gadavist)
Comparison: None.

________________________________________________________________________________________________ 
MULTI PARAMETRIC MRI PROSTATE W/O AND W, 11/25/2021 [DATE]: 
CLINICAL INDICATION: Elevated PSA. History of prior TURP.
TECHNIQUE: Multiple parametric sequences were performed. Patient was scanned on 
a 3T magnet. 
Pre-contrast: T1 axial of the entire pelvis. T2 sagittal, axial and coronal, T1 
axial, acquired of the prostate. Diffusion with multiple B values of 7000, 7111 
calculated ADC value for mapping.  
Post contrast: Rapid sequence dynamic and axial planes through the prostate and 
seminal vesicles, T1 axial with fat sat of the entire pelvis. 3-D renderings 
were reconstructed on an independent workstation. The images were also evaluated 
with Dyna CAD computer aided detection. 10 of Gadavist were injected 
intravenously. 0 mL of Gadavist was discarded. As per [HOSPITAL] guidelines 3-D reconstructions are performed with concurrent physician 
supervision.

[Series 101: survey-include kidney(hilum) · axial · 10.0mm · 1.34mm/px · 1 of 14 slices shown]
[im 1/14]
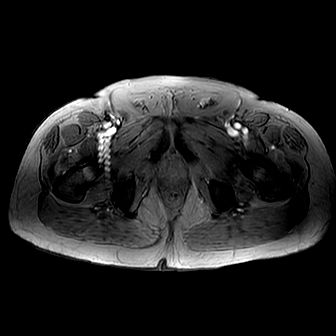

[Series 201: t1w_tse_ax · axial · 6.0mm · 0.77mm/px · 1 of 36 slices shown]
[im 1/36]
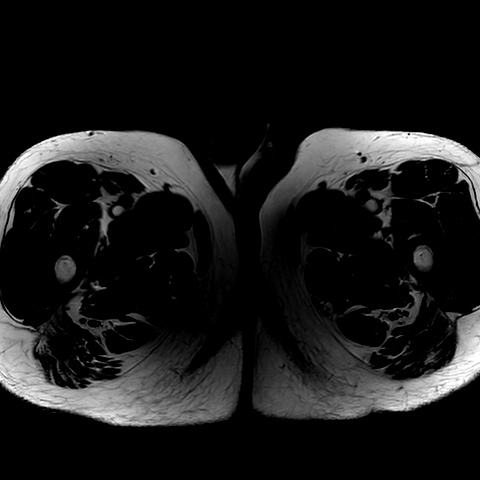

[Series 301: t2w sag · sagittal · 3.0mm · 0.45mm/px · 1 of 30 slices shown]
[im 1/30]
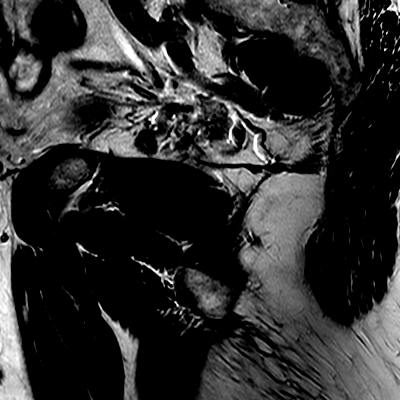

[Series 401: t2w cor · coronal · 3.0mm · 0.42mm/px · 1 of 30 slices shown]
[im 1/30]
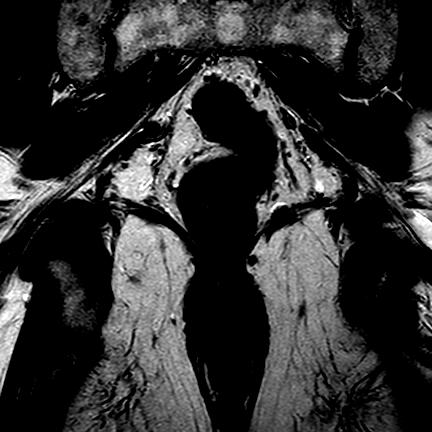

[Series 501: t2w ax · axial · 3.0mm · 0.27mm/px · 1 of 34 slices shown]
[im 1/34]
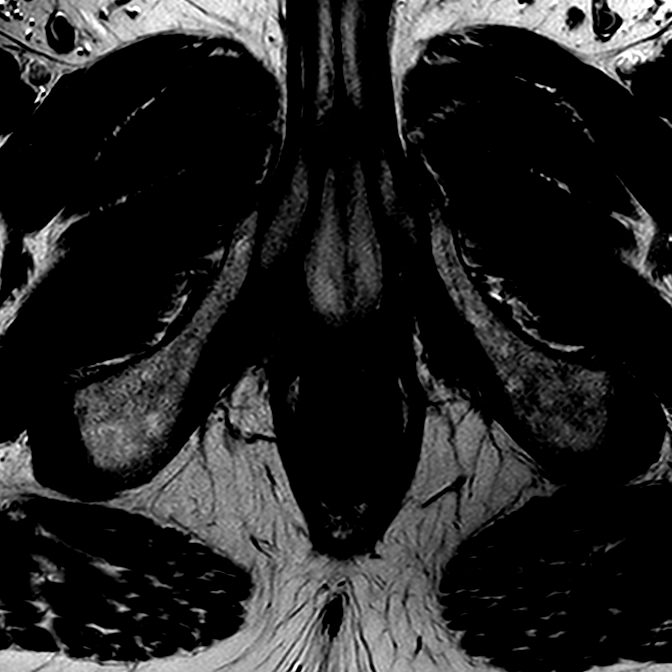

[Series 601: new-dwi_3b* 3mm* · axial · 3.0mm · 1.28mm/px · 1 of 68 slices shown]
[im 1/68]
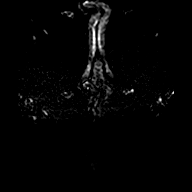

[Series 602: ADC · axial · 3.0mm · 1.28mm/px · 1 of 34 slices shown (1 of 2)]
[im 1/34]
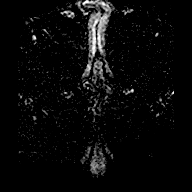

[Series 603: ADC · axial · 3.0mm · 1.28mm/px · 1 of 34 slices shown (2 of 2)]
[im 1/34]
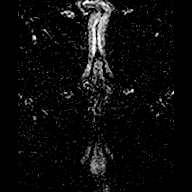

[Series 604: (id) · axial · 3.0mm · 1.28mm/px · 1 of 34 slices shown (1 of 3)]
[im 1/34]
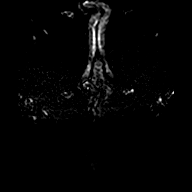

[Series 605: (id) · axial · 3.0mm · 1.28mm/px · 1 of 34 slices shown (2 of 3)]
[im 1/34]
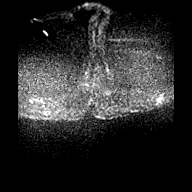

[Series 701: (id) · axial · 3.0mm · 1.28mm/px · 1 of 34 slices shown (3 of 3)]
[im 1/34]
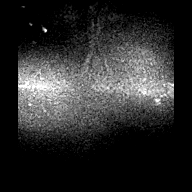

[Series 901: dyn 3mm*(ap) · axial · 3.0mm · 1.38mm/px · z∈[-34,+65]mm · 36 of 1530 slices shown]
[im 1/1530]
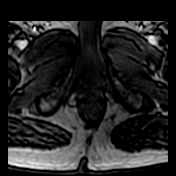
[im 44/1530]
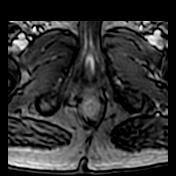
[im 88/1530]
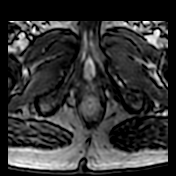
[im 132/1530]
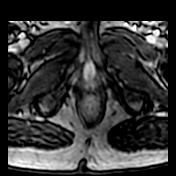
[im 175/1530]
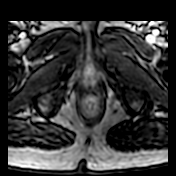
[im 219/1530]
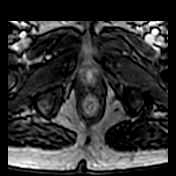
[im 263/1530]
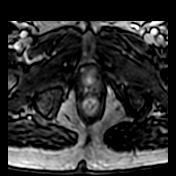
[im 306/1530]
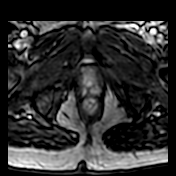
[im 350/1530]
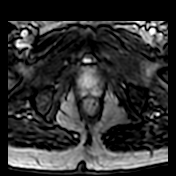
[im 394/1530]
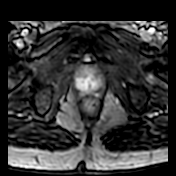
[im 437/1530]
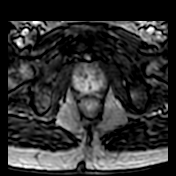
[im 481/1530]
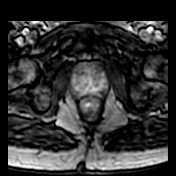
[im 525/1530]
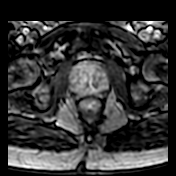
[im 568/1530]
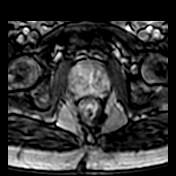
[im 612/1530]
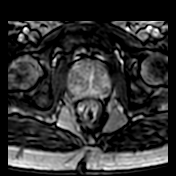
[im 656/1530]
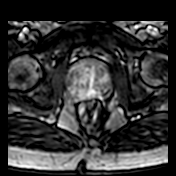
[im 699/1530]
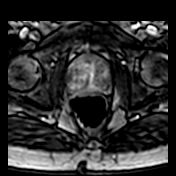
[im 743/1530]
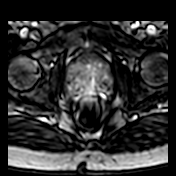
[im 787/1530]
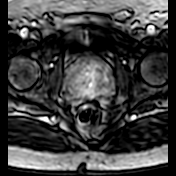
[im 831/1530]
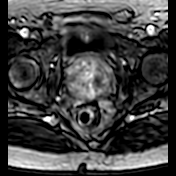
[im 874/1530]
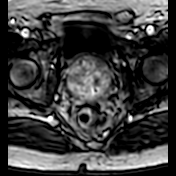
[im 918/1530]
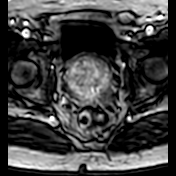
[im 962/1530]
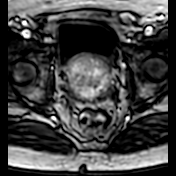
[im 1005/1530]
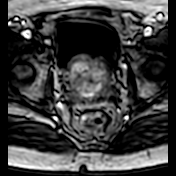
[im 1049/1530]
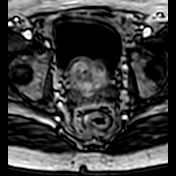
[im 1093/1530]
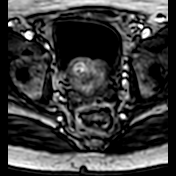
[im 1136/1530]
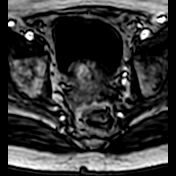
[im 1180/1530]
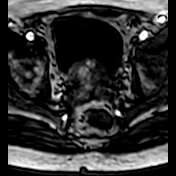
[im 1224/1530]
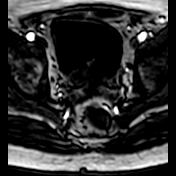
[im 1267/1530]
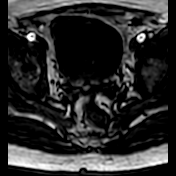
[im 1311/1530]
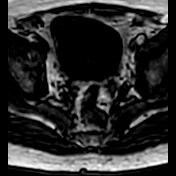
[im 1355/1530]
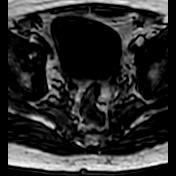
[im 1398/1530]
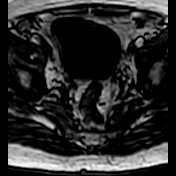
[im 1442/1530]
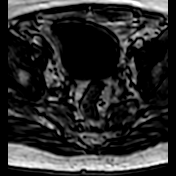
[im 1486/1530]
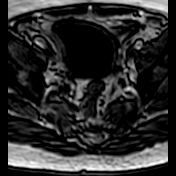
[im 1530/1530]
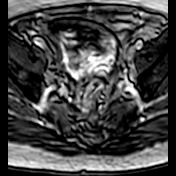

[Series 1002: DIXON · axial · 6.0mm · 0.52mm/px · 1 of 36 slices shown]
[im 1/36]
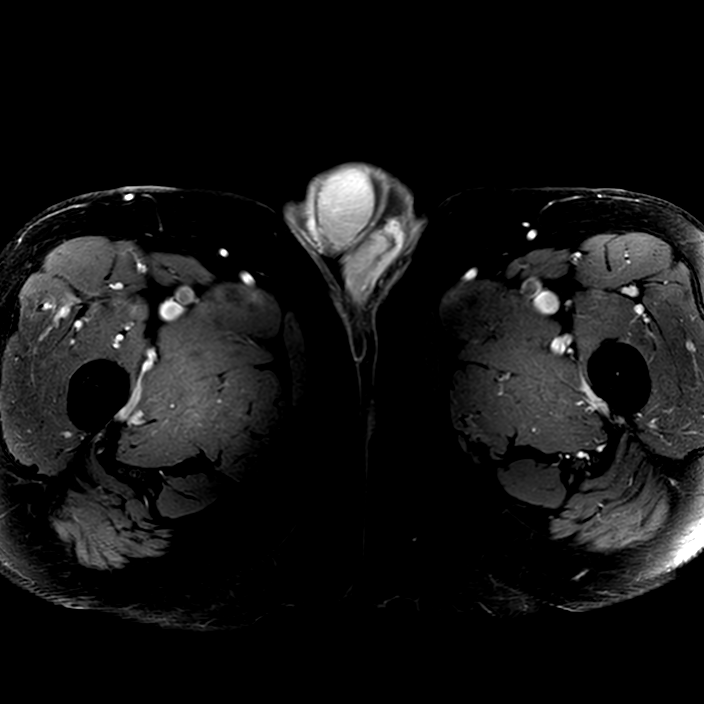

[48 of 48 positions shown; findings below may reference images not displayed]

FINDINGS: VOLUME: The prostate measures 6.5 x 6.4 x 7.5 cm with a volume of 151 cc. 
CENTRAL GLAND: Pronounced nodular hyperplasia of the central gland is present 
without suspicious focal abnormality. There is mild median lobe hypertrophy 
extending up into the inferior bladder lumen. 
PERIPHERAL ZONE: No suspicious peripheral zone lesions are found. There are no 
foci of restricted diffusion. 
PROSTATE CAPSULE: Intact. 
MUSCLE SIDE WALLS: Normal. 
SEMINAL VESICLES: Normal. 
BLADDER: Unremarkable. 
LYMPHADENOPATHY: No morphologically suspicious pelvic lymph nodes are found. 
BONES: No suspicious skeletal lesions evident in the pelvic girdle. 
ADDITIONAL FINDINGS: Fusion hardware and degenerative changes are in the lower 
lumbar spine. Small amounts of fat extend into the LEFT inguinal canal.
IMPRESSION: No MRI evidence for clinically significant prostatic malignancy. 
(PI-RADS 2): Low (clinically significant cancer is unlikely to be present).

## 2022-05-29 IMAGING — MR MRI THORACIC SPINE WITHOUT CONTRAST
4 of 9 series · 11 of 48 positions shown · non-contrast
Comparison: Thoracic MRI December 11, 2020

________________________________________________________________________________________________ 
MRI THORACIC SPINE WITHOUT CONTRAST, 05/29/2022 [DATE]: 
CLINICAL INDICATION: Postlaminectomy syndrome, preop planning
TECHNIQUE: Sagittal T1, Sagittal T2, Sagittal STIR, Axial T2 and Axial T1 MR 
images of the thoracic were performed without intravenous contrast enhancement.

[Series 101: t2_sag_count · sagittal · 4.0mm · 0.62mm/px · 4 of 22 slices shown]
[im 1/22]
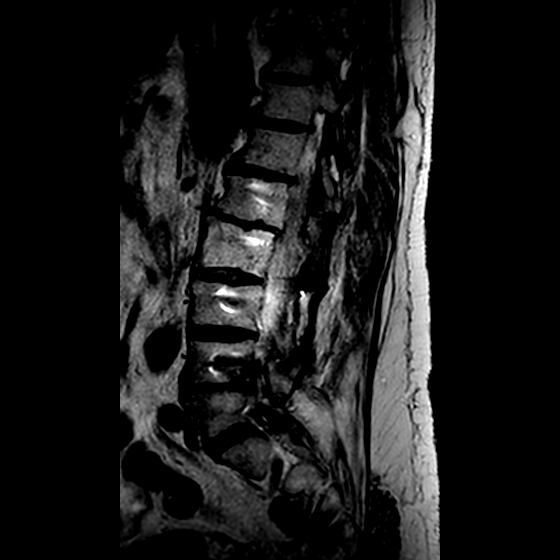
[im 8/22]
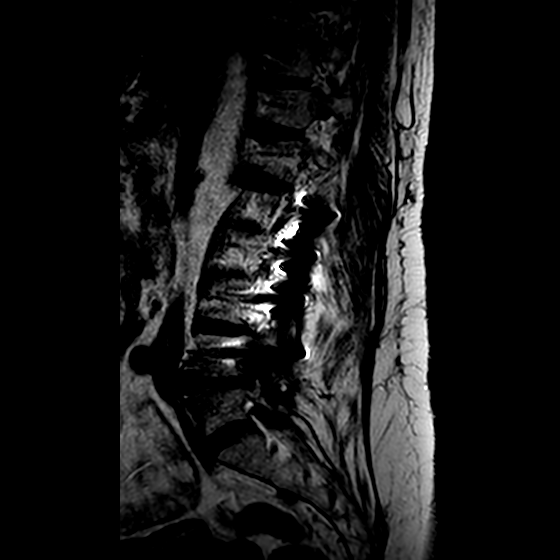
[im 15/22]
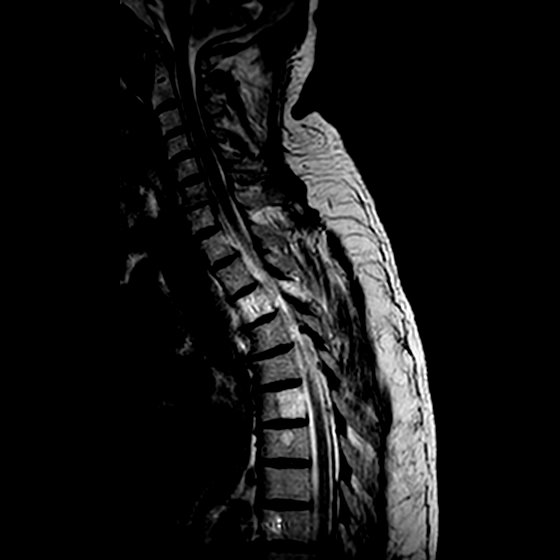
[im 22/22]
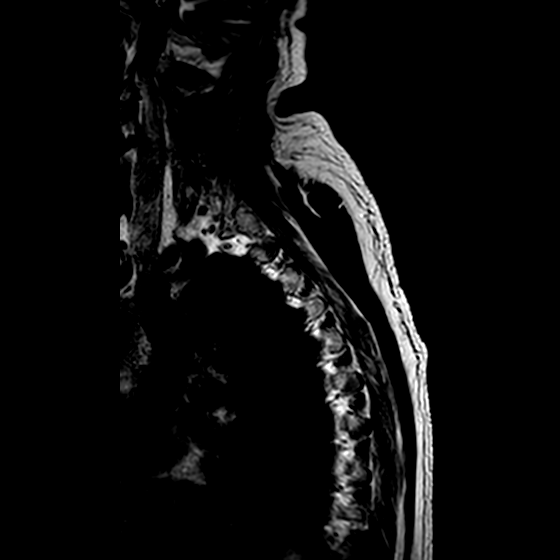

[Series 102: T2 · sagittal · 4.0mm · 0.62mm/px · 2 of 11 slices shown]
[im 1/11]
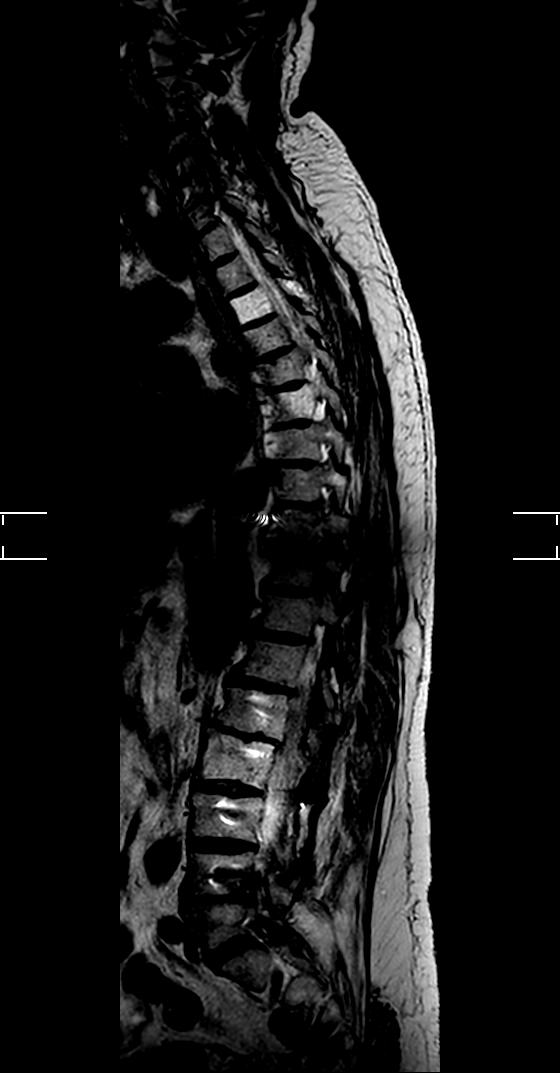
[im 11/11]
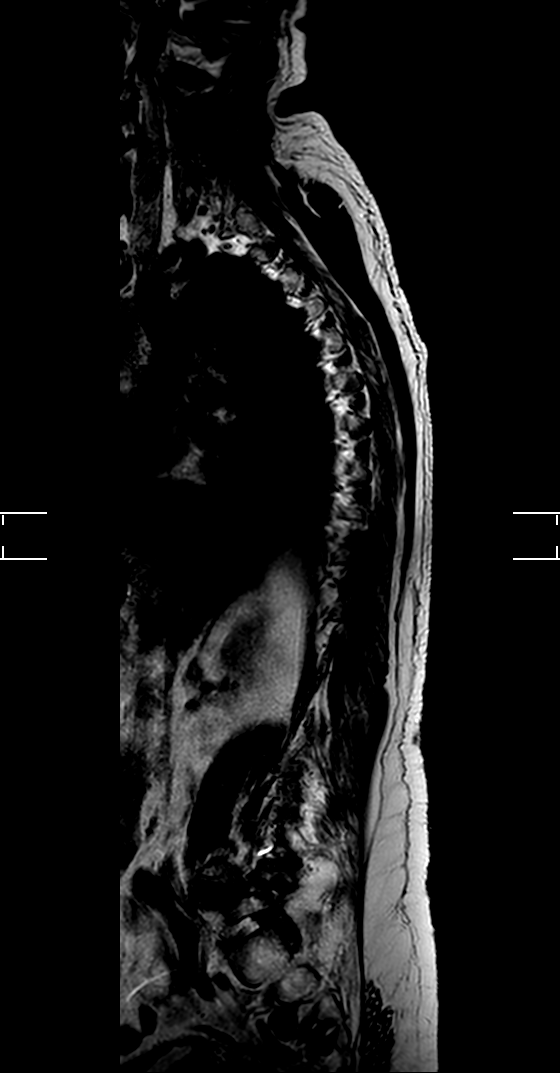

[Series 201: t2_cor_count · coronal · 4.0mm · 0.61mm/px · 2 of 15 slices shown]
[im 1/15]
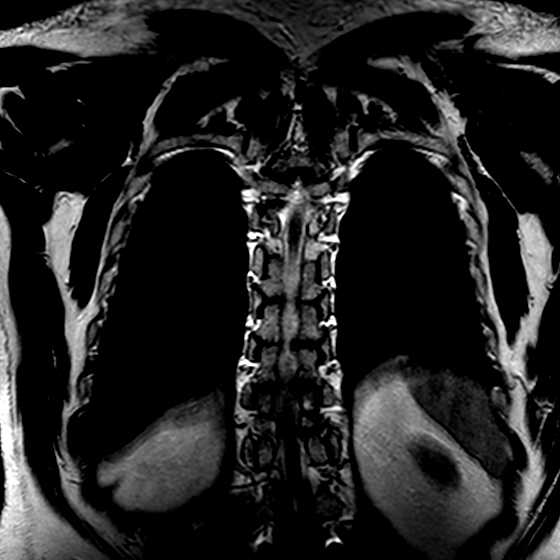
[im 15/15]
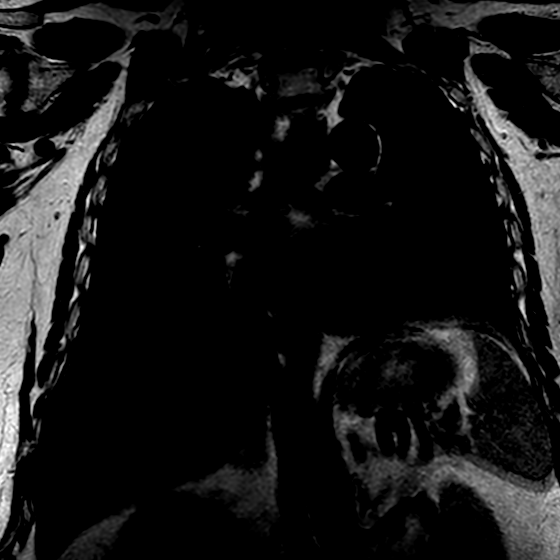

[Series 301: t1_tse_sag · sagittal · 3.0mm · 0.47mm/px · 3 of 21 slices shown]
[im 1/21]
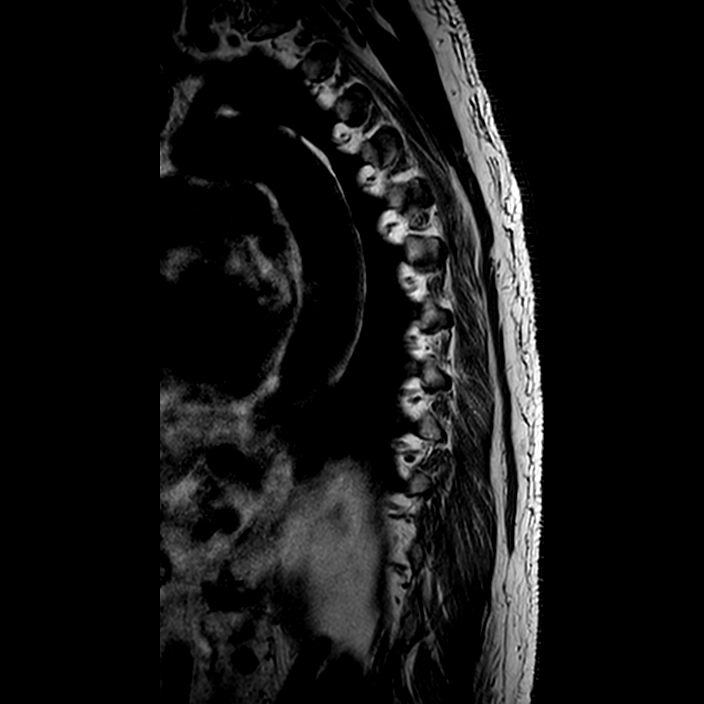
[im 11/21]
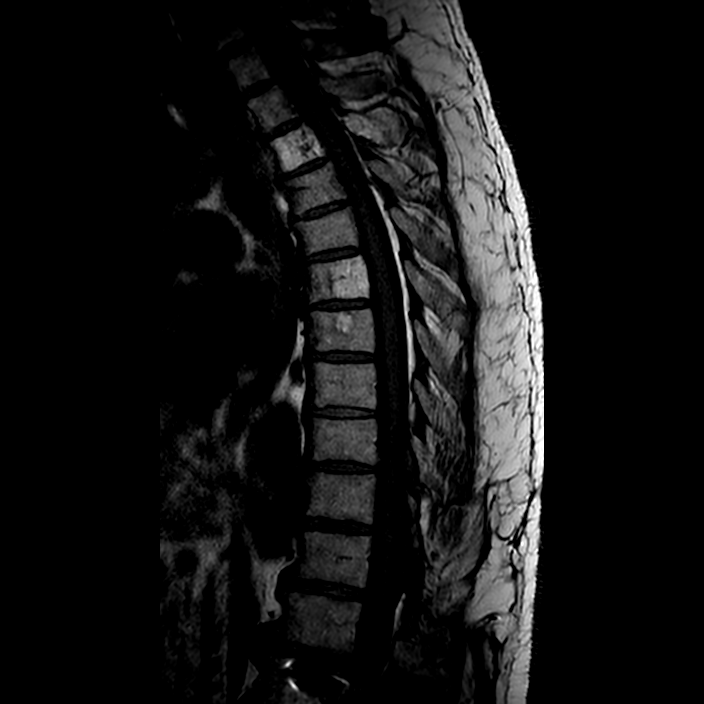
[im 21/21]
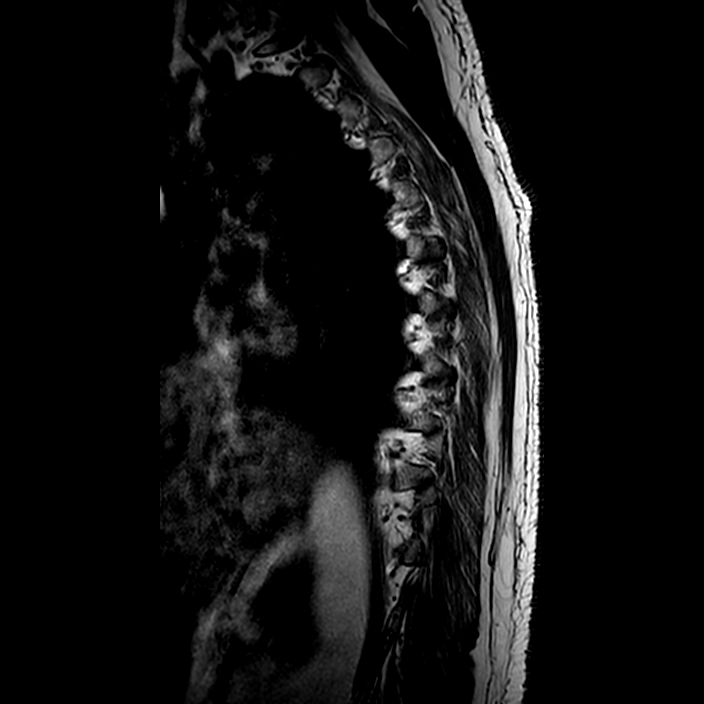

[11 of 48 positions shown; findings below may reference images not displayed]

FINDINGS: There is slight loss of height along the upper T5 endplate. This 
appears chronic. Other thoracic vertebral heights are intact. There are large 
hemangiomas within the T3 and T6 segments. There is no Modic type I change in 
the thoracic spine. 
Thoracic cord signal is normal. The conus terminates opposite T12-L1. 
There is posterior disc bulging at T8-9, T9-10, T10-11 and T11-12. There is no 
extrinsic cord deformity. There is mild canal stenosis at T10-11, best seen 
sagittal image 12 where dorsal ligament is thickened and touches the dorsal 
cord. This is also seen on axial image 19. Canal diameter at this level is
mm. 
There is mild lower thoracic dextroscoliosis. 
There is no layering pleural fluid. Limited views of the mediastinum shows no 
adenopathy. No paraspinal mass. 
There are bilateral pedicle screws at L1.
IMPRESSION: Thoracic degenerative changes. Mild canal stenosis at T10-11 due to disc bulge 
and dorsal ligamentous thickening. There is effacement of the dorsal thecal sac 
at this level. Similar findings on the December 2020 MRI. No other significant 
thoracic canal stenosis. 
No evidence for recent fracture. Mild chronic loss of height along the upper T5 
endplate. 
Thoracic cord signal is normal. Conus terminates at T12-L1. 
Bilateral pedicle screws at L1. 
Mild thoracic dextroscoliosis. 
No evidence for thoracic malignancy.

## 2022-11-16 IMAGING — DX THORACIC SPINE 3 VIEWS
1 series · 4 of 4 positions shown · non-contrast
Comparison: MRI thoracic spine from May 29, 2022.

________________________________________________________________________________________________ 
THORACIC SPINE 3 VIEWS, 11/16/2022 [DATE]: 
CLINICAL INDICATION: Spinal cord stimulator placement.

[Series 1: AP · U · 0.14mm/px · 4 of 4 slices shown]
[im 1/4]
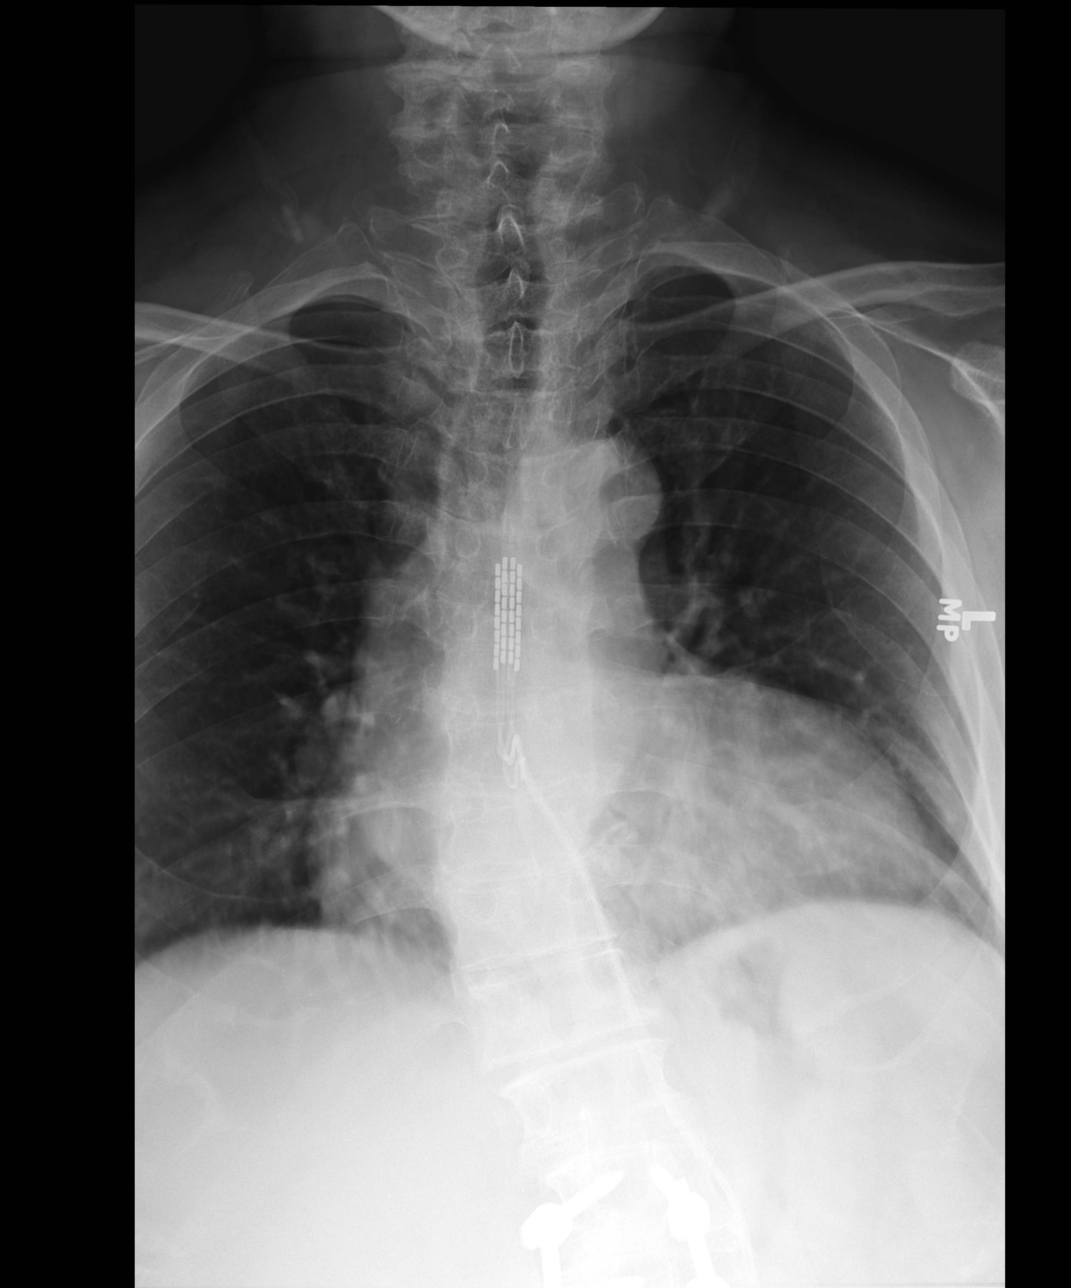
[im 2/4]
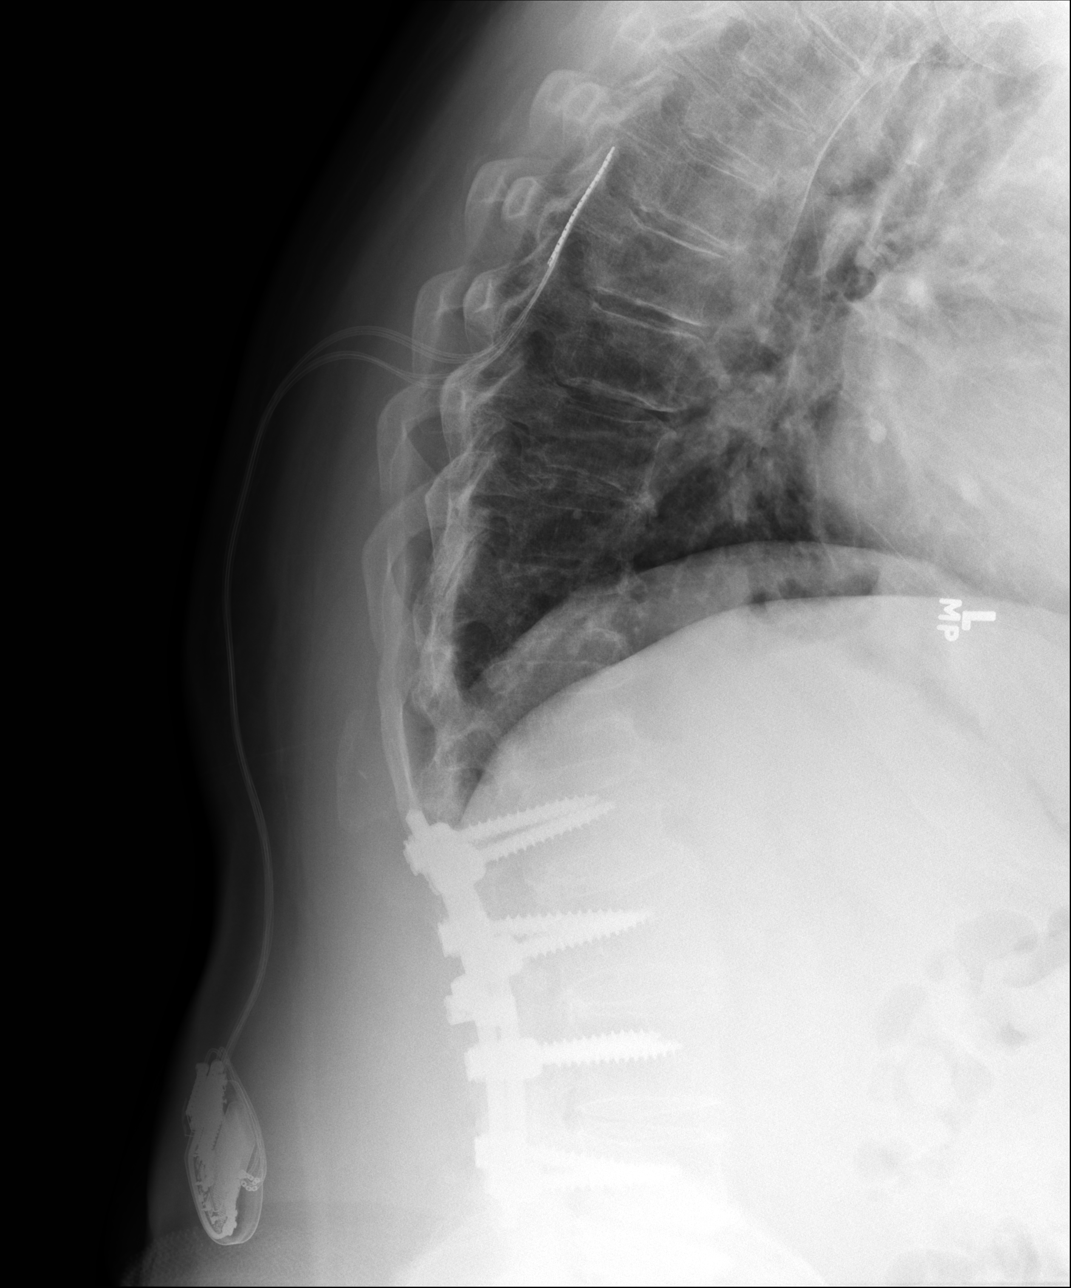
[im 3/4]
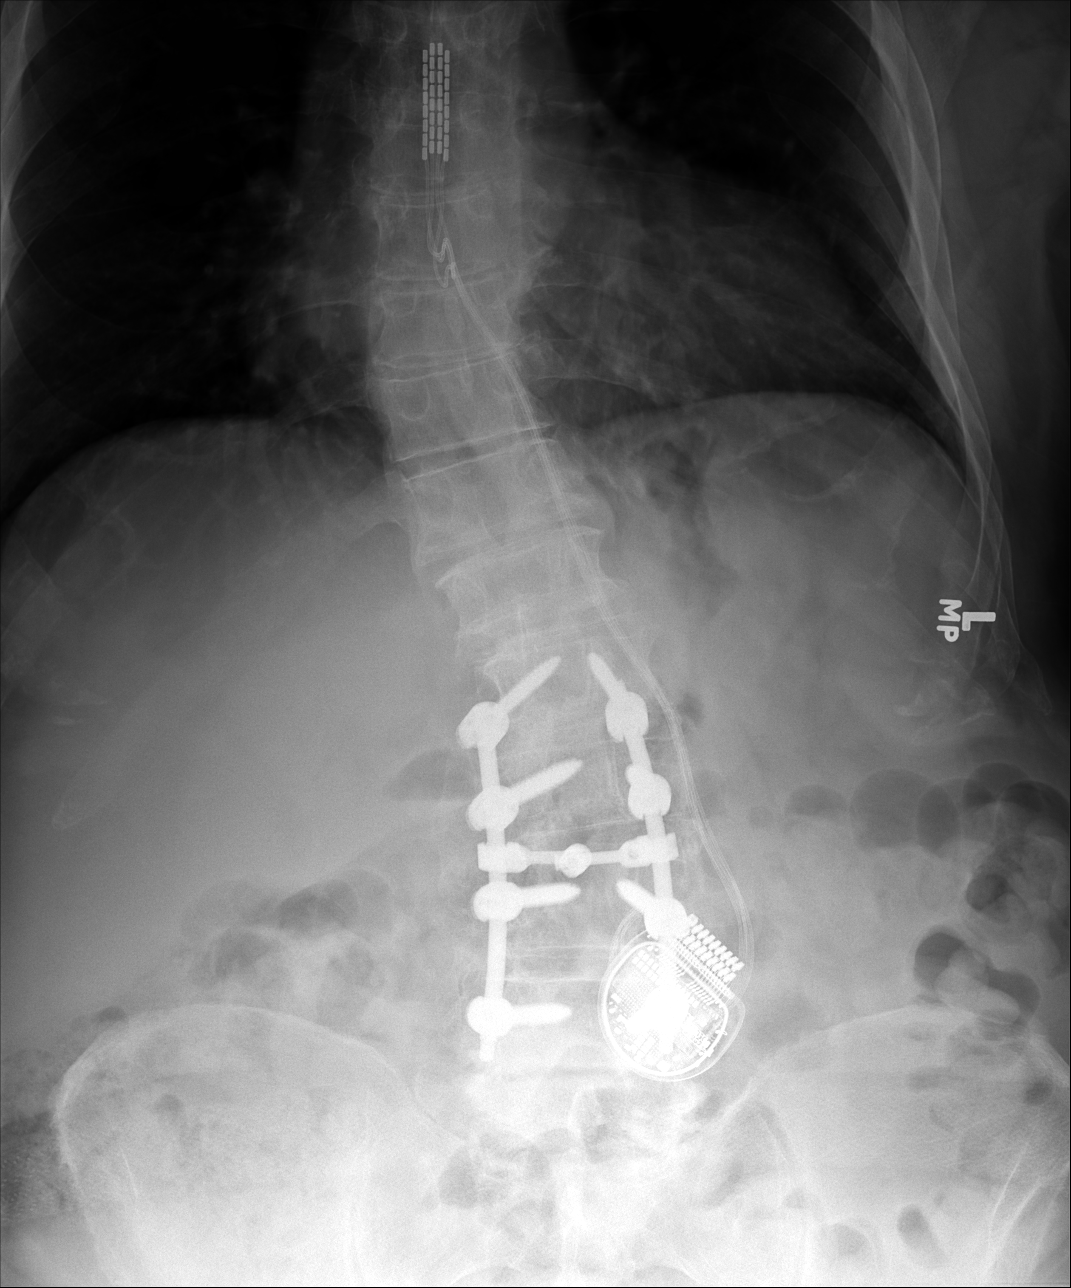
[im 4/4]
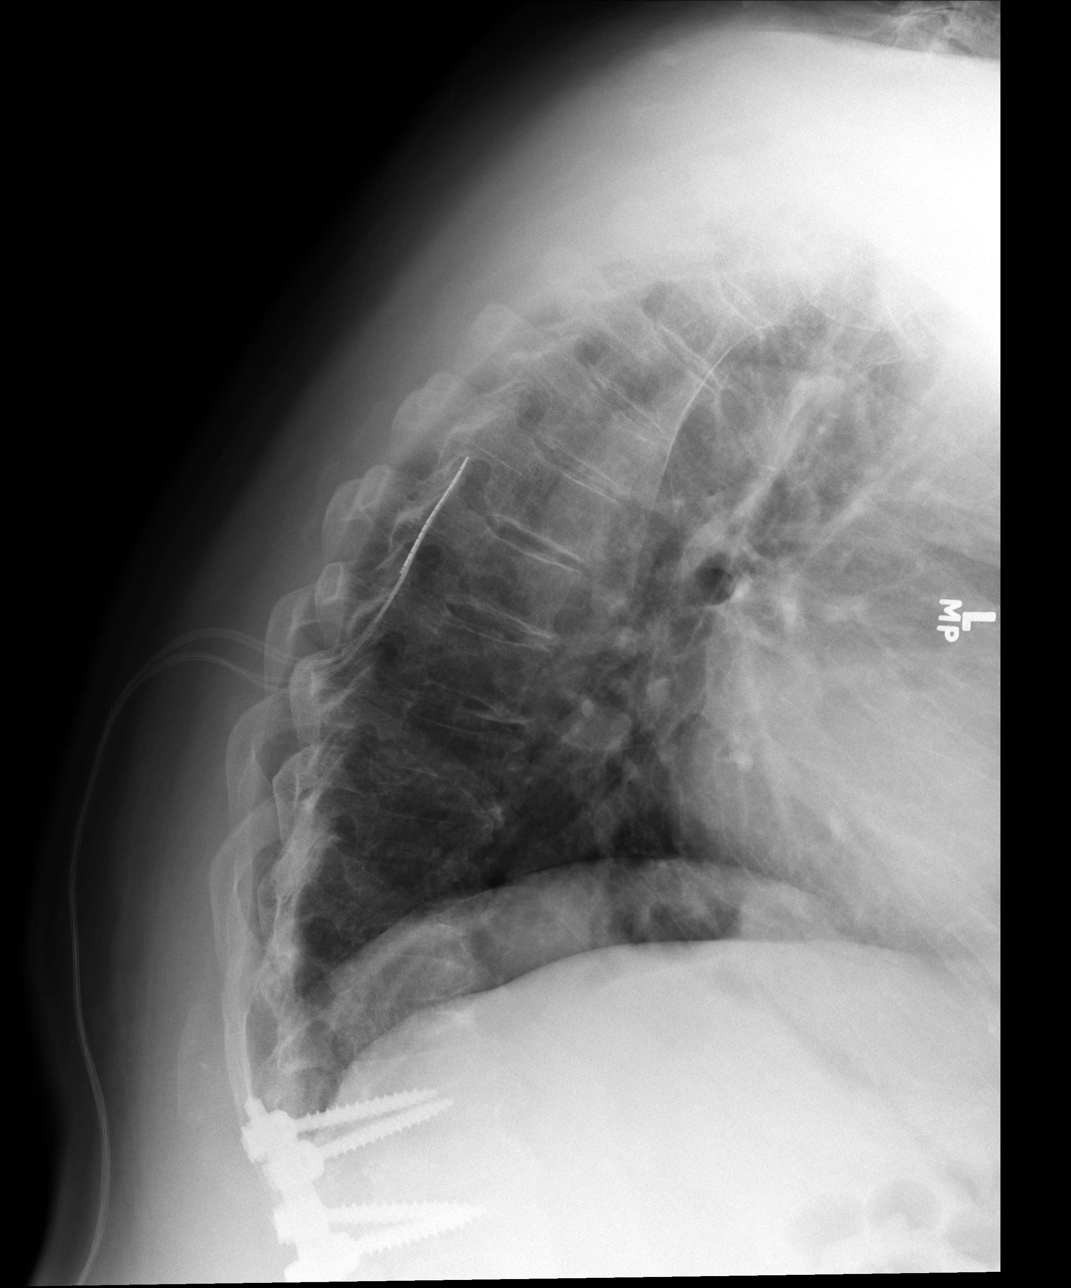

[4 of 4 positions shown; findings below may reference images not displayed]

FINDINGS: Surgical fusion hardware in the lumbar spine from L2 through L5 with pedicle 
screws and posterior fusion rods. Spinal stimulator in place with generator 
posterior to the left side of L5. Leads extend superiorly in the posterior 
subcutaneous fat, entering into the central spinal canal at T9-T10 level, with 
leads extending superiorly to the T7 level. 
Rightward curvature of the thoracic spine. Leftward curvature of the lumbar 
spine. No acute fracture. Bones are osteopenic. Degenerative change at L5-S1 and 
in the cervical spine. Cardiomegaly.
IMPRESSION: Lumbar hardware fusion. Spinal stimulator.

## 2023-06-28 IMAGING — CT CT RIGHT SHOULDER WITH CONTRAST POST ARTHROGRAM
2 series · 14 of 27 positions shown, 18 images · IV contrast (isovue)
Comparison: None

________________________________________________________________________________________________ 
CT RIGHT SHOULDER WITH CONTRAST POST ARTHROGRAM, 06/28/2023 [DATE]: 
CLINICAL INDICATION: Right shoulder pain
TECHNIQUE: The right shoulder was scanned with contrast on a high resolution low 
dose CT scanner. Diluted iodinated contrast was injected into the glenohumeral 
joint before CT imaging: As per injection dictation, 15 mL dilute mixture was 
injected (containing 7.5 mL of Isovue 300 MDV and sterile saline). Routine MPR 
reconstructions were performed. Count of known CT and Cardiac Nuclear Medicine 
studies performed in the previous 12 months = .

[Series 9: axial · axial · 0.49mm/px · z∈[-186,-32]mm · 9 of 182 slices shown, 12 images]
[im 14/182  soft-tissue]
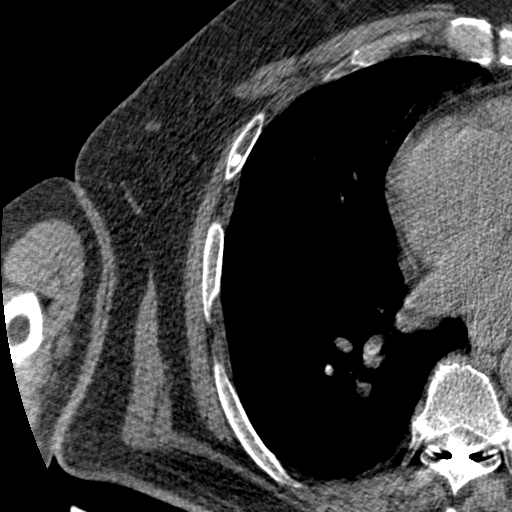
[im 14/182  bone]
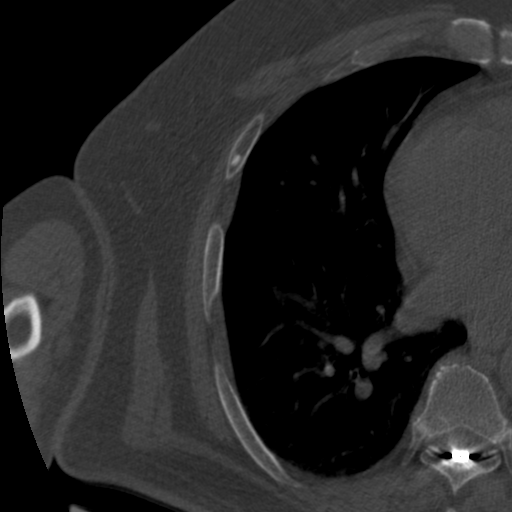
[im 42/182  bone]
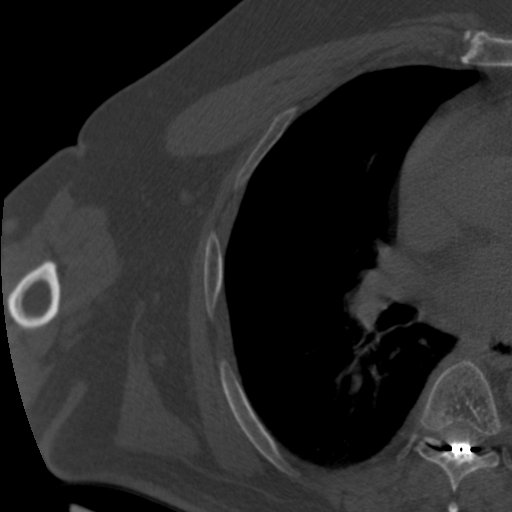
[im 56/182  bone]
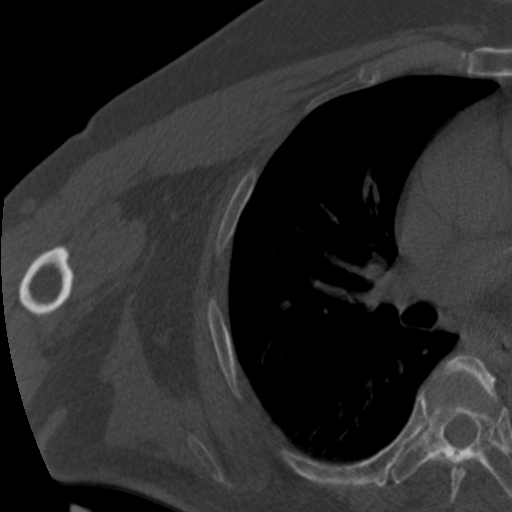
[im 70/182  bone]
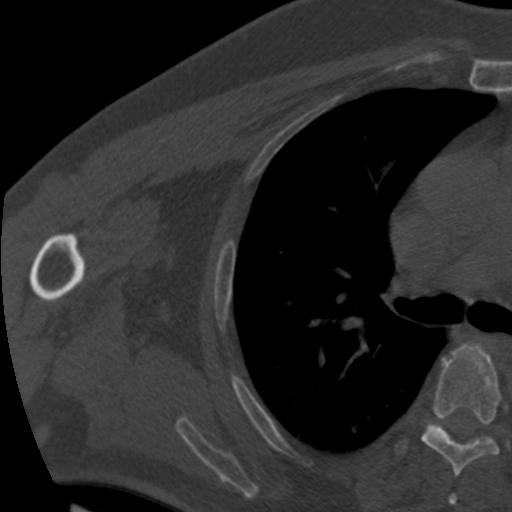
[im 98/182  soft-tissue]
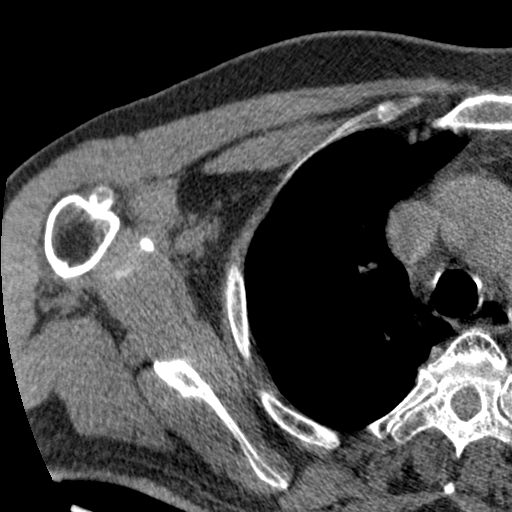
[im 98/182  bone]
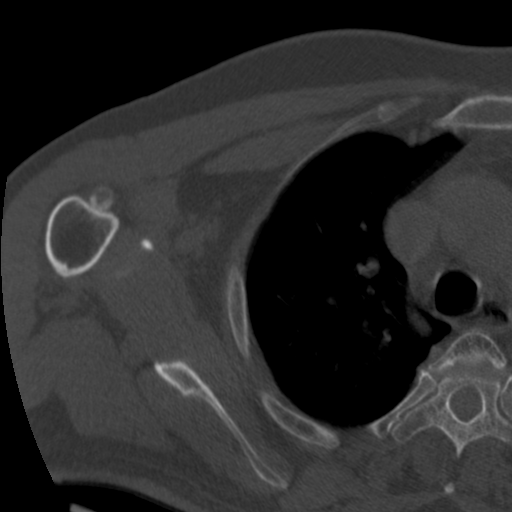
[im 112/182  bone]
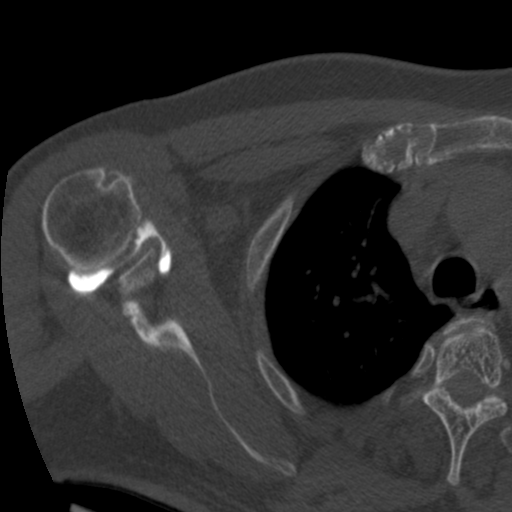
[im 126/182  bone]
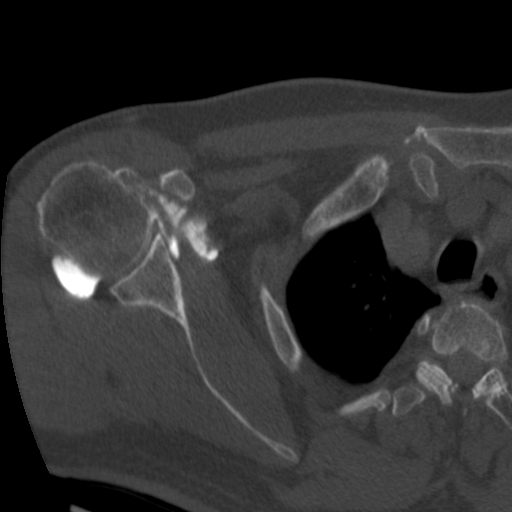
[im 154/182  bone]
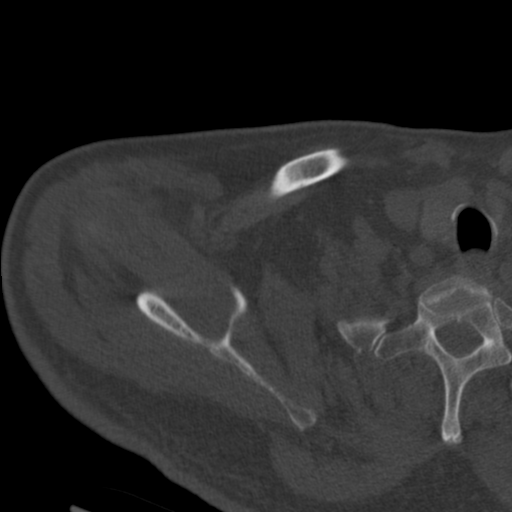
[im 168/182  soft-tissue]
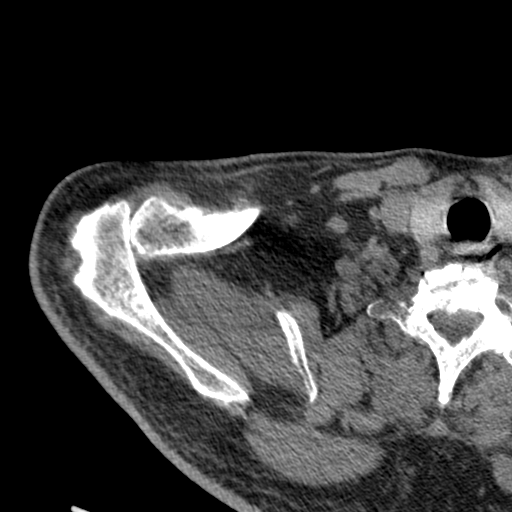
[im 168/182  bone]
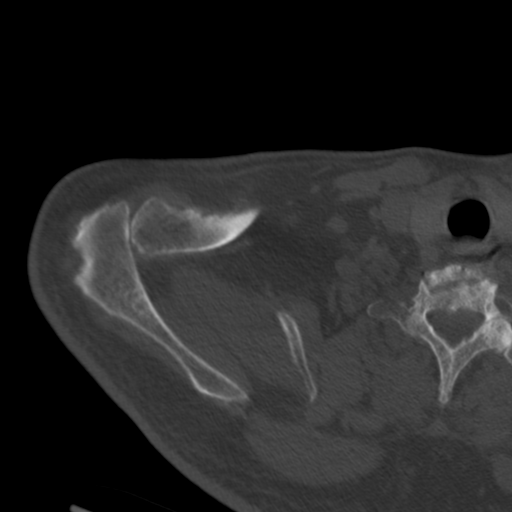

[Series 602: rt cor · sagittal · 0.49mm/px · 5 of 78 slices shown, 6 images]
[im 26/78  bone]
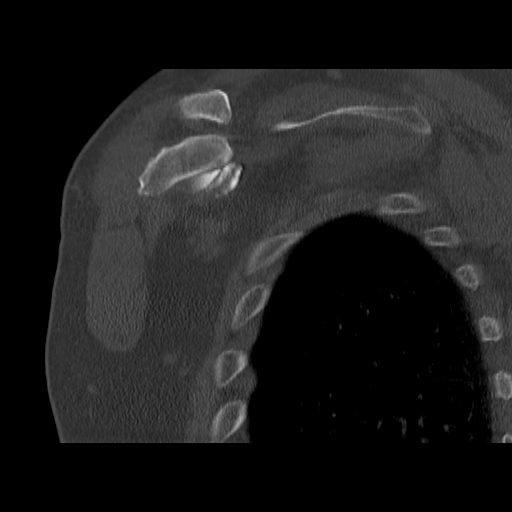
[im 33/78  bone]
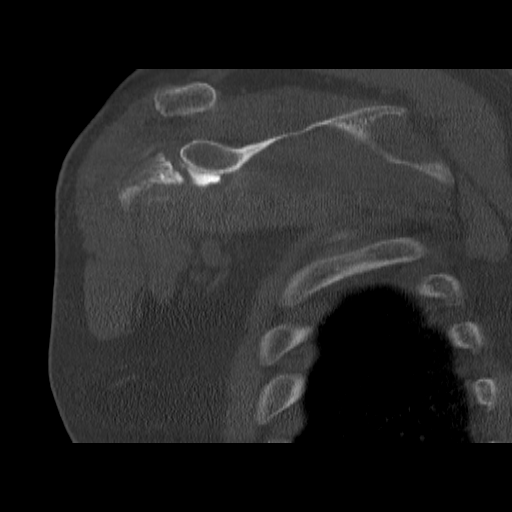
[im 39/78  soft-tissue]
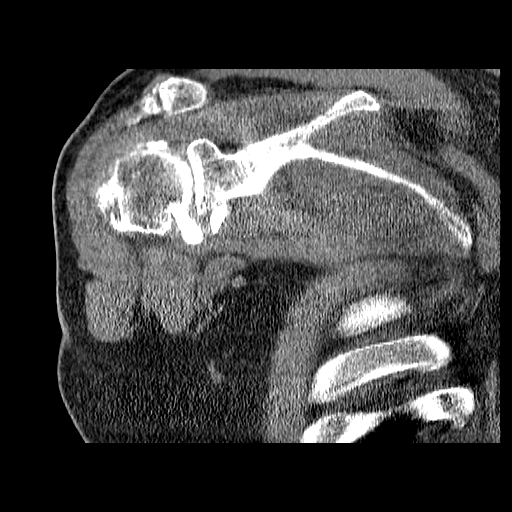
[im 39/78  bone]
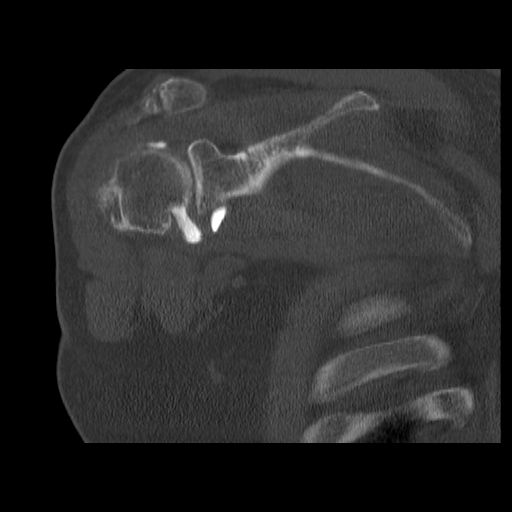
[im 45/78  bone]
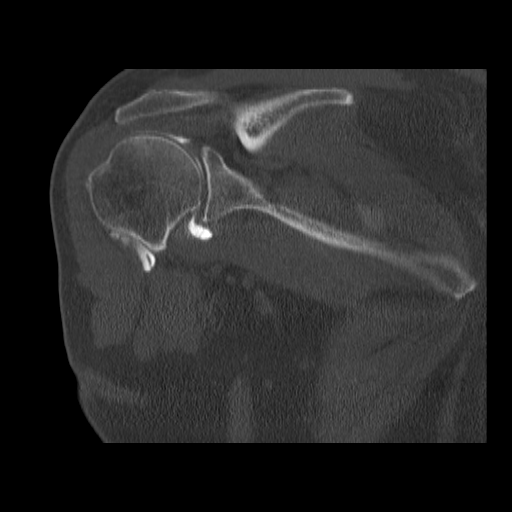
[im 52/78  bone]
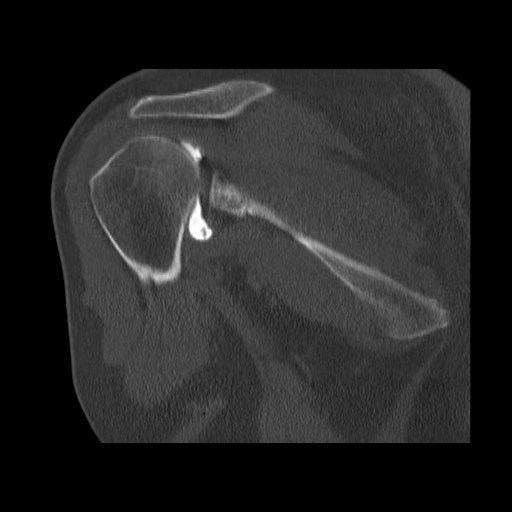

[14 of 27 positions shown; findings below may reference images not displayed]

FINDINGS: ROTATOR CUFF: No full-thickness tear of the supraspinatus, infraspinatus, 
subscapularis and teres minor tendons. 0.4 cm calcification in the distal 
infraspinatus tendon, consistent with calcific tendinosis. No rotator cuff 
muscular atrophy.  
ACROMIOCLAVICULAR JOINT: Mild AC joint degenerative change without mass effect 
upon the supraspinatus. . The acromium is normal in morphology. 
GLENOHUMERAL JOINT: The humeral head is well located within the glenoid fossa. 
The glenoid labrum is preserved. Contrast extends into the recess adjacent to 
the anterosuperior labrum (normal variant). No paralabral cyst. No 
full-thickness tear of the long head of biceps tendon. Mild synovitis. 
STERNOCLAVICULAR JOINT: Mild degenerative change. 
SPINE: Multilevel degenerative change of the spine. 
BONES AND SOFT TISSUES: No fracture or Hill-Sachs deformity. Spinal stimulator. 
0.7 x 1.2 x 2.0 cm fatty nodule in the teres major muscle, consistent with 
lipoma. The axillary region is negative. Subcutaneous tissues are negative.
IMPRESSION: 1.  No rotator cuff tear. Mild infraspinatus calcific tendinosis. 
2.  Mild AC joint degenerative change without mass effect upon the 
supraspinatus. 
3.  Mild glenohumeral joint synovitis. 
4.  Small teres major intramuscular lipoma. 
5.  Spinal stimulator. 
RADIATION DOSE REDUCTION: All CT scans are performed using radiation dose 
reduction techniques, when applicable. Technical factors are evaluated and 
adjusted to ensure appropriate moderation of exposure. Automated dose management 
technology is applied to adjust the radiation doses to minimize exposure while 
achieving diagnostic quality images.

## 2023-06-28 IMAGING — CT CT GUIDED INJECTION FOR CT SHOULDER ARTHROGRAM
1 of 2 series · 9 of 14 positions shown, 12 images · IV contrast (isovue)
Comparison: none

________________________________________________________________________________________________ 
CT GUIDED INJECTION FOR CT SHOULDER ARTHROGRAM 06/28/2023 [DATE]: 
CLINICAL INDICATION:  Pain In Right Shoulder 
Count of known CT and Cardiac Nuclear Medicine studies performed in the previous 
12 months = 0
TECHNIQUE: The patient was placed supine on the CT table. The right shoulder was 
scanned with contrast on a high-resolution low dose CT scanner. The overlying 
skin was sterilely prepped and draped. Ethyl chloride spray was used for skin 
anesthesia A 20-gauge was advanced into the right glenohumeral joint using CT 
guidance. Once intra-articular placement of the needle tip was obtained, 15 mL 
dilute mixture containing 7.5 mL of Isovue 300 MDV  with sterile saline was 
injected into the joint. The needle tip was withdrawn. No immediate 
complications.

[Series 5: bone · axial · 0.48mm/px · z∈[-176,-40]mm · 9 of 172 slices shown, 12 images]
[im 18/172  soft-tissue]
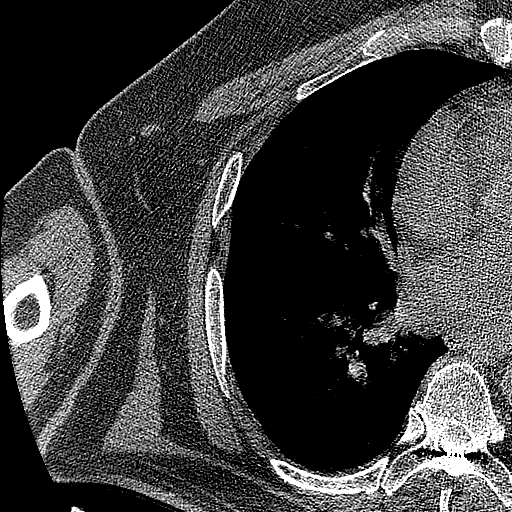
[im 18/172  bone]
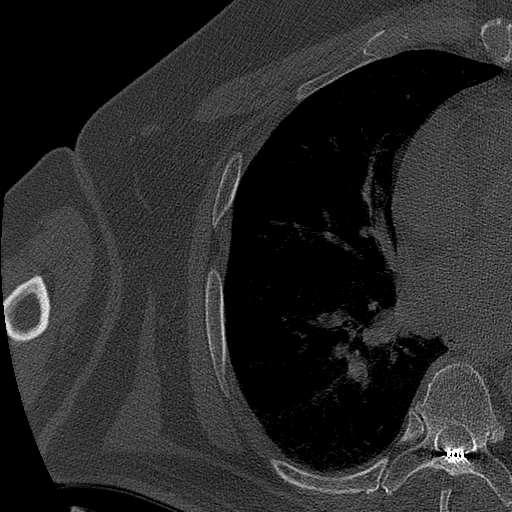
[im 35/172  bone]
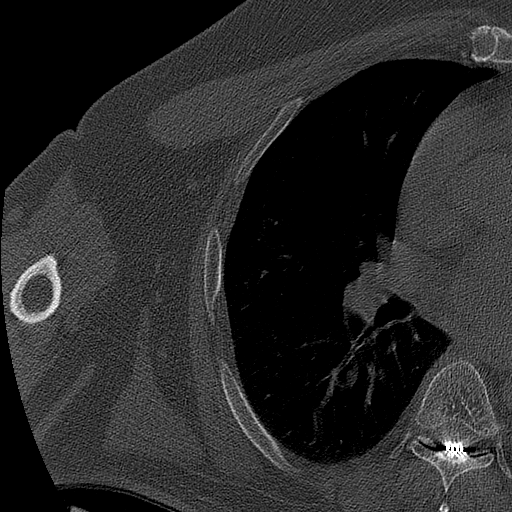
[im 52/172  bone]
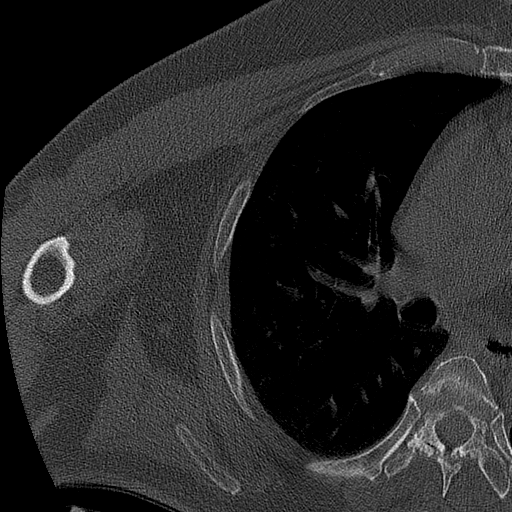
[im 69/172  bone]
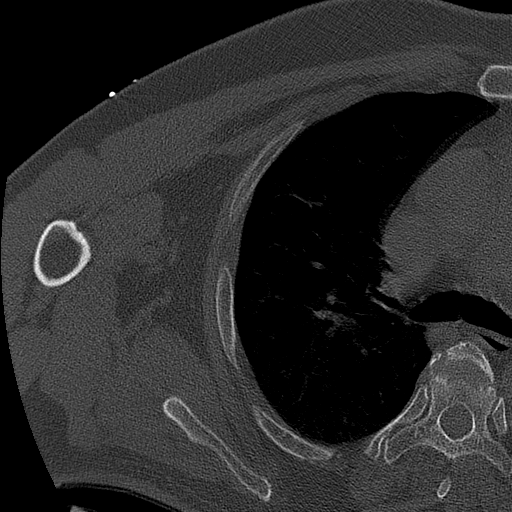
[im 86/172  soft-tissue]
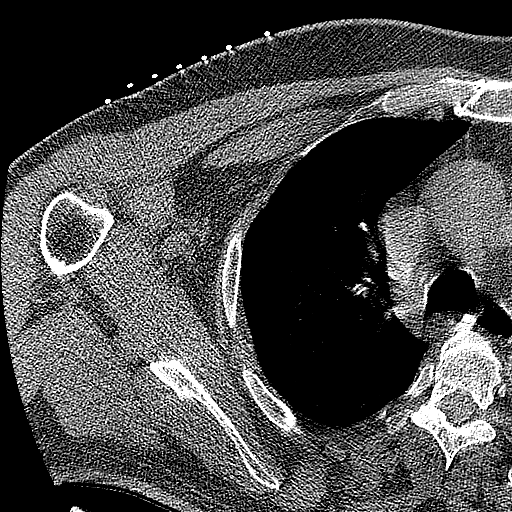
[im 86/172  bone]
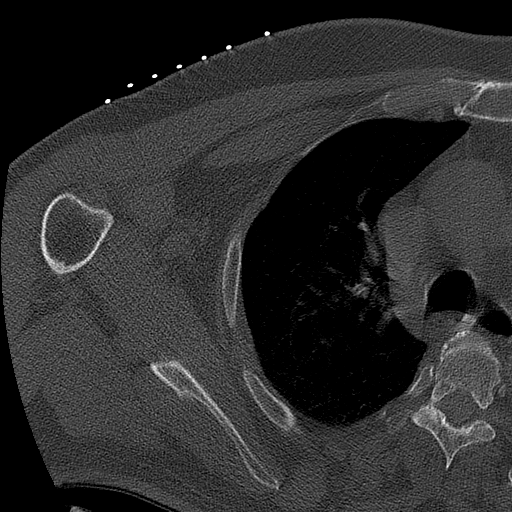
[im 103/172  bone]
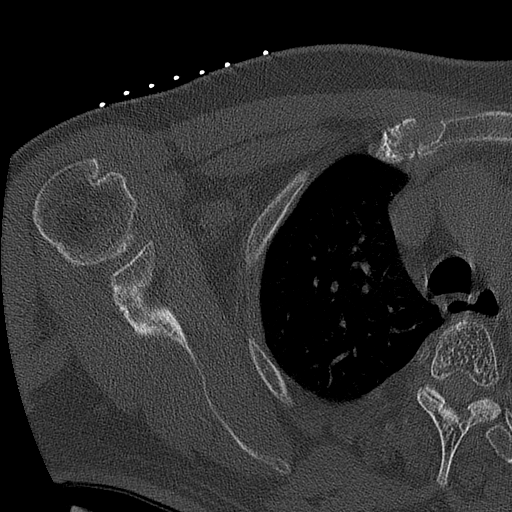
[im 120/172  bone]
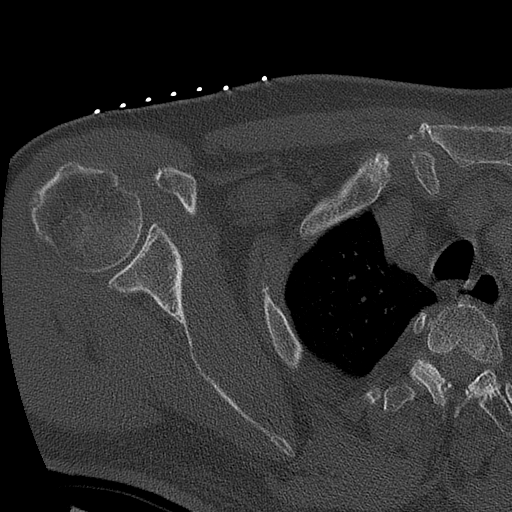
[im 137/172  bone]
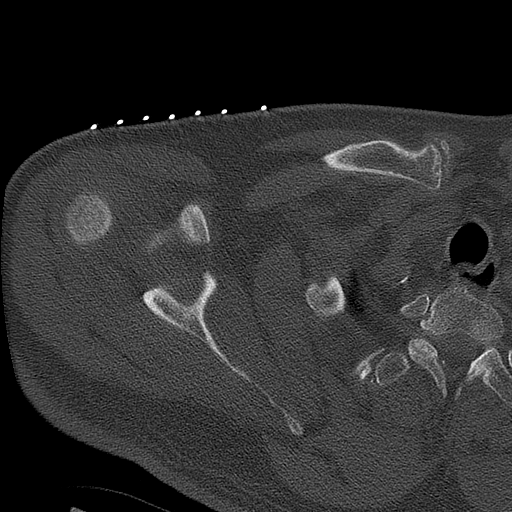
[im 154/172  soft-tissue]
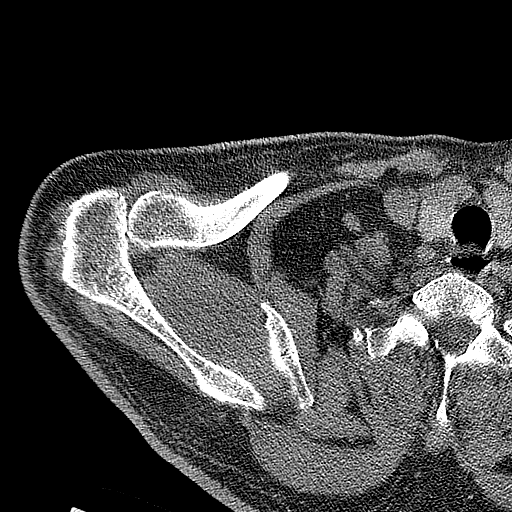
[im 154/172  bone]
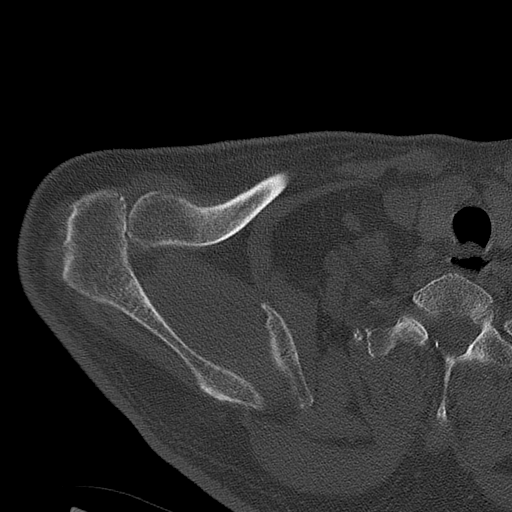

[9 of 14 positions shown; findings below may reference images not displayed]

FINDINGS: Contrast is noted within the joint space.
IMPRESSION: Status post right shoulder arthrogram. 
RADIATION DOSE REDUCTION: All CT scans are performed using radiation dose 
reduction techniques, when applicable.  Technical factors are evaluated and 
adjusted to ensure appropriate moderation of exposure.  Automated dose 
management technology is applied to adjust the radiation doses to minimize 
exposure while achieving diagnostic quality images.
